# Patient Record
Sex: Female | Born: 1965 | Race: White | Hispanic: No | Marital: Married | Smoking: Never smoker
Health system: Southern US, Community
[De-identification: ages and names within clinical notes are randomized; demographics above are authoritative.]

## PROBLEM LIST (undated history)

## (undated) DIAGNOSIS — Z9889 Other specified postprocedural states: Secondary | ICD-10-CM

## (undated) DIAGNOSIS — F419 Anxiety disorder, unspecified: Secondary | ICD-10-CM

## (undated) DIAGNOSIS — R7303 Prediabetes: Secondary | ICD-10-CM

## (undated) DIAGNOSIS — T8859XA Other complications of anesthesia, initial encounter: Secondary | ICD-10-CM

## (undated) DIAGNOSIS — T4145XA Adverse effect of unspecified anesthetic, initial encounter: Secondary | ICD-10-CM

## (undated) DIAGNOSIS — F329 Major depressive disorder, single episode, unspecified: Secondary | ICD-10-CM

## (undated) DIAGNOSIS — R112 Nausea with vomiting, unspecified: Secondary | ICD-10-CM

## (undated) DIAGNOSIS — F32A Depression, unspecified: Secondary | ICD-10-CM

## (undated) DIAGNOSIS — I10 Essential (primary) hypertension: Secondary | ICD-10-CM

## (undated) DIAGNOSIS — T7840XA Allergy, unspecified, initial encounter: Secondary | ICD-10-CM

## (undated) DIAGNOSIS — Z8619 Personal history of other infectious and parasitic diseases: Secondary | ICD-10-CM

## (undated) DIAGNOSIS — G43909 Migraine, unspecified, not intractable, without status migrainosus: Secondary | ICD-10-CM

## (undated) DIAGNOSIS — M1990 Unspecified osteoarthritis, unspecified site: Secondary | ICD-10-CM

## (undated) DIAGNOSIS — I499 Cardiac arrhythmia, unspecified: Secondary | ICD-10-CM

## (undated) HISTORY — PX: ENDOMETRIAL ABLATION: SHX621

## (undated) HISTORY — PX: CHOLECYSTECTOMY: SHX55

## (undated) HISTORY — DX: Personal history of other infectious and parasitic diseases: Z86.19

## (undated) HISTORY — DX: Anxiety disorder, unspecified: F41.9

## (undated) HISTORY — DX: Prediabetes: R73.03

## (undated) HISTORY — DX: Major depressive disorder, single episode, unspecified: F32.9

## (undated) HISTORY — DX: Allergy, unspecified, initial encounter: T78.40XA

## (undated) HISTORY — PX: TONSILLECTOMY AND ADENOIDECTOMY: SUR1326

## (undated) HISTORY — DX: Depression, unspecified: F32.A

## (undated) HISTORY — DX: Essential (primary) hypertension: I10

## (undated) HISTORY — DX: Migraine, unspecified, not intractable, without status migrainosus: G43.909

## (undated) HISTORY — PX: APPENDECTOMY: SHX54

---
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
## 1999-02-16 ENCOUNTER — Inpatient Hospital Stay (HOSPITAL_COMMUNITY): Admission: RE | Admit: 1999-02-16 | Discharge: 1999-02-17 | Payer: Self-pay | Admitting: Surgery

## 1999-02-16 ENCOUNTER — Encounter: Payer: Self-pay | Admitting: Surgery

## 2000-01-12 ENCOUNTER — Other Ambulatory Visit: Admission: RE | Admit: 2000-01-12 | Discharge: 2000-01-12 | Payer: Self-pay | Admitting: Family Medicine

## 2000-09-01 ENCOUNTER — Encounter: Payer: Self-pay | Admitting: Obstetrics and Gynecology

## 2000-09-01 ENCOUNTER — Ambulatory Visit (HOSPITAL_COMMUNITY): Admission: RE | Admit: 2000-09-01 | Discharge: 2000-09-01 | Payer: Self-pay | Admitting: Obstetrics and Gynecology

## 2001-08-21 ENCOUNTER — Other Ambulatory Visit: Admission: RE | Admit: 2001-08-21 | Discharge: 2001-08-21 | Payer: Self-pay | Admitting: Family Medicine

## 2002-06-27 ENCOUNTER — Other Ambulatory Visit: Admission: RE | Admit: 2002-06-27 | Discharge: 2002-06-27 | Payer: Self-pay | Admitting: Obstetrics and Gynecology

## 2002-07-25 ENCOUNTER — Encounter (INDEPENDENT_AMBULATORY_CARE_PROVIDER_SITE_OTHER): Payer: Self-pay | Admitting: Specialist

## 2002-07-25 ENCOUNTER — Ambulatory Visit (HOSPITAL_COMMUNITY): Admission: RE | Admit: 2002-07-25 | Discharge: 2002-07-25 | Payer: Self-pay | Admitting: Obstetrics and Gynecology

## 2004-06-21 ENCOUNTER — Encounter: Admission: RE | Admit: 2004-06-21 | Discharge: 2004-06-21 | Payer: Self-pay | Admitting: Family Medicine

## 2004-07-29 ENCOUNTER — Other Ambulatory Visit: Admission: RE | Admit: 2004-07-29 | Discharge: 2004-07-29 | Payer: Self-pay | Admitting: Obstetrics and Gynecology

## 2004-08-03 ENCOUNTER — Encounter: Admission: RE | Admit: 2004-08-03 | Discharge: 2004-08-03 | Payer: Self-pay | Admitting: Obstetrics and Gynecology

## 2004-08-09 ENCOUNTER — Ambulatory Visit (HOSPITAL_COMMUNITY): Admission: RE | Admit: 2004-08-09 | Discharge: 2004-08-09 | Payer: Self-pay | Admitting: Obstetrics and Gynecology

## 2005-10-13 IMAGING — US US ABDOMEN COMPLETE
1 series · 13 of 25 positions shown · non-contrast
Comparison: none

CLINICAL DATA: Abdominal bruit heard on physical exam.  Evaluate for abdominal aortic aneurysm.     
 COMPLETE ABDOMINAL ULTRASOUND: 
 Comparison none. 
 Real time multiplanar gray-scale ultrasonography of the abdomen reveals no evidence for aneurysmal dilatation.  Maximum obtainable diameter is in the AP plane at 2.3cm.  
 The liver has diffusely coarsened echotexture throughout although no focal abnormality is identified.  The imaged portions of the pancreatic head and body are unremarkable although the tail is not well seen.  There is no evidence for intra or extrahepatic biliary duct dilatation with the extrahepatic common duct measuring 4-5mm in diameter.  
 The right kidney is 12.1cm in long axis.  Left kidney measures 12.2cm.  There appears to be mild fullness in the intrarenal collecting system on the left.  
 Spleen measures 15cm in craniocaudal length which is technically enlarged.  No focal abnormality is seen in the splenic parenchyma.

[Series 1: unknown · 13 of 50 slices shown]
[im 1/50]
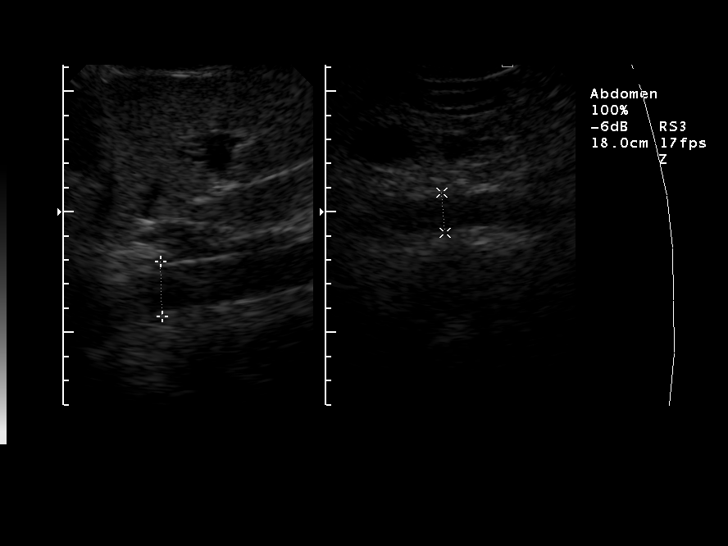
[im 5/50]
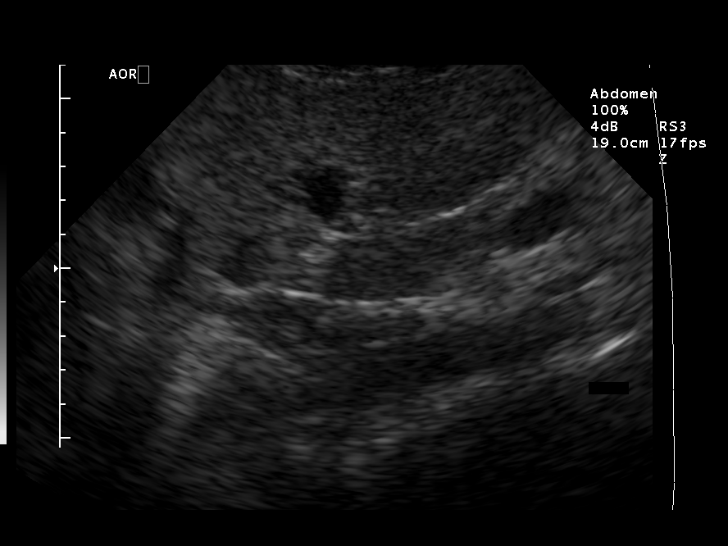
[im 9/50]
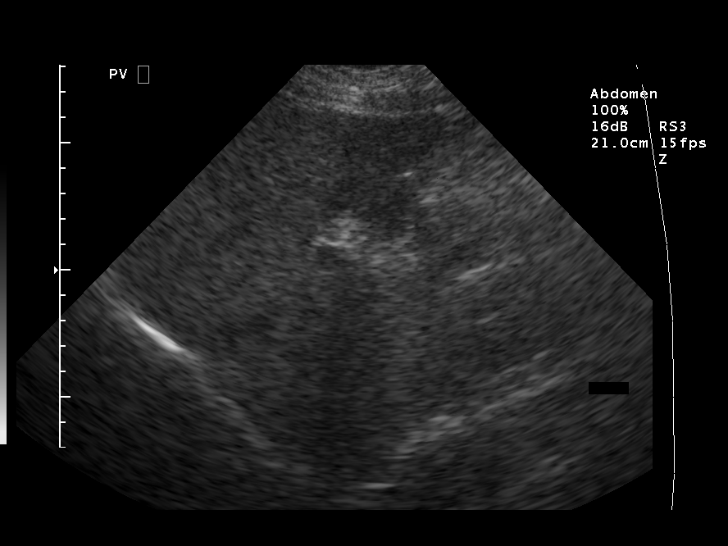
[im 13/50]
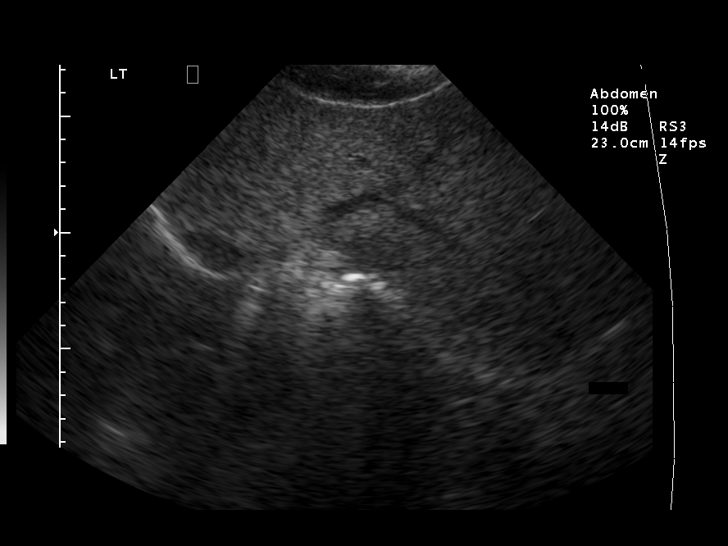
[im 17/50]
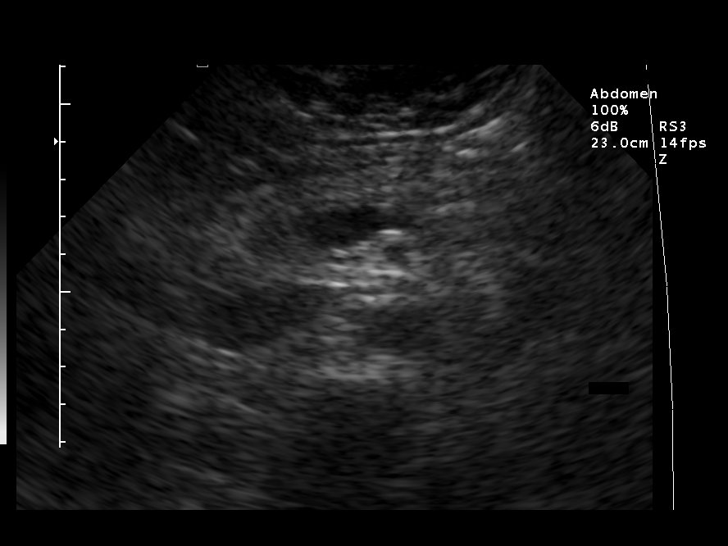
[im 21/50]
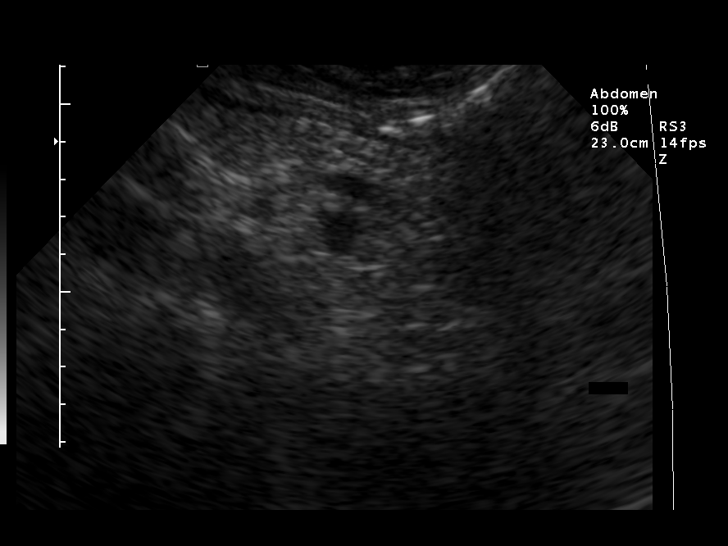
[im 25/50]
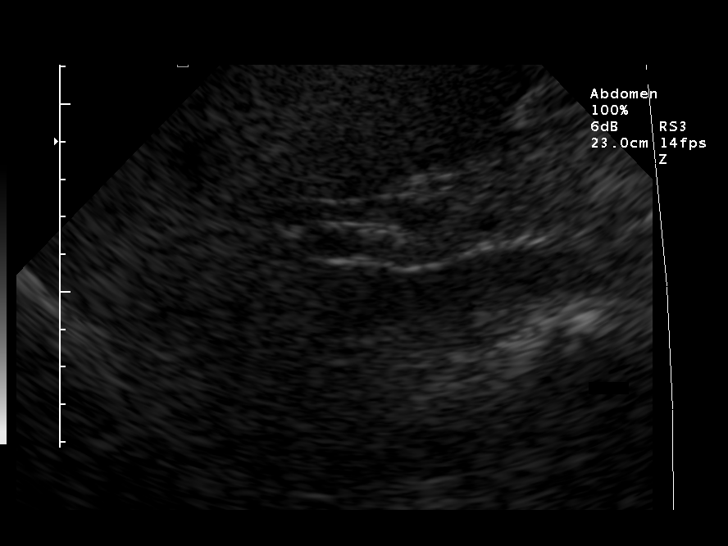
[im 29/50]
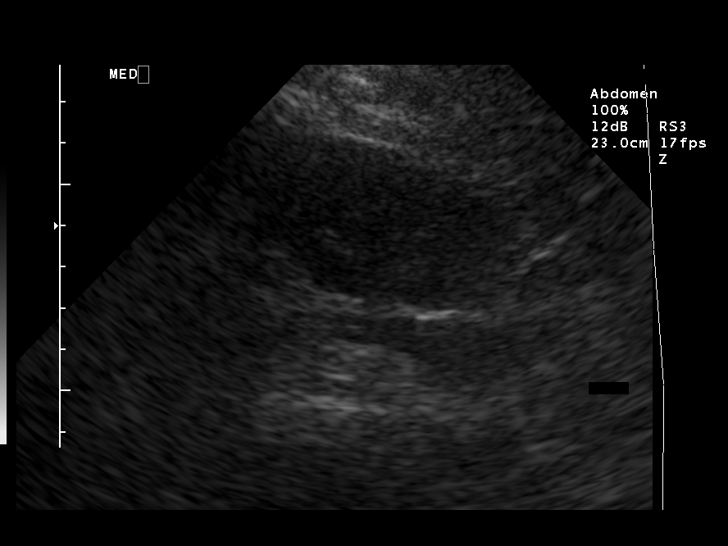
[im 33/50]
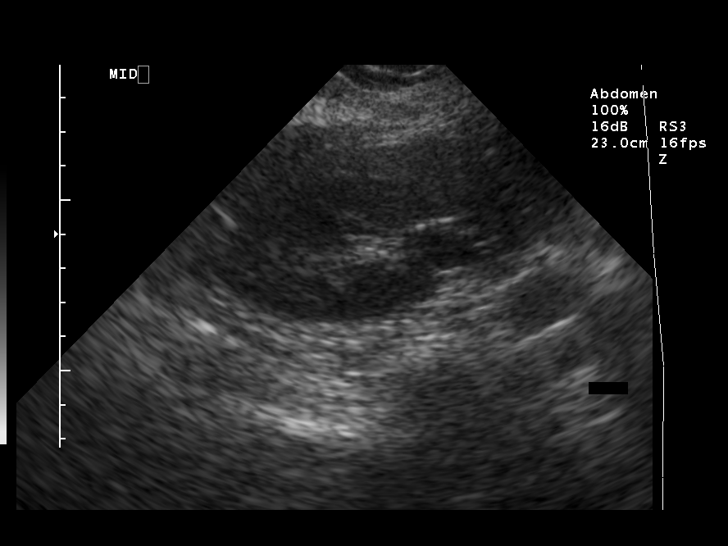
[im 37/50]
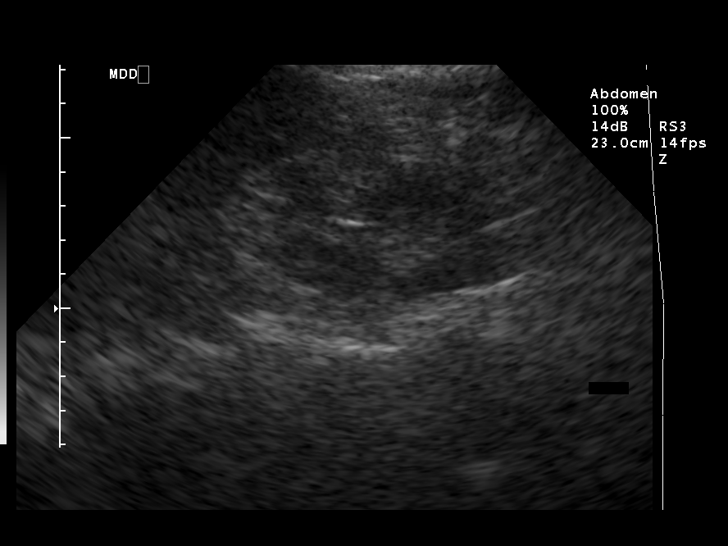
[im 41/50]
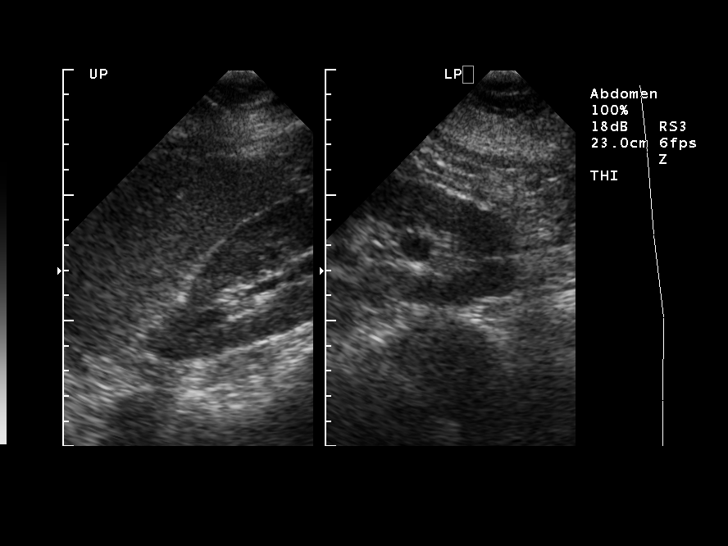
[im 45/50]
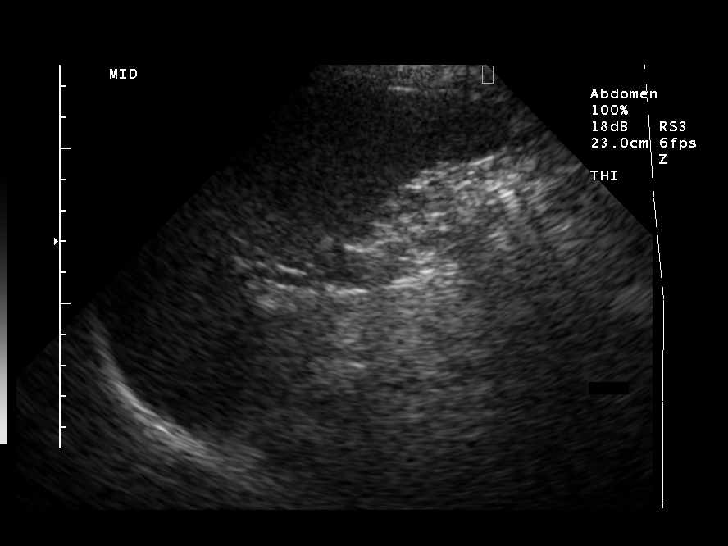
[im 50/50]
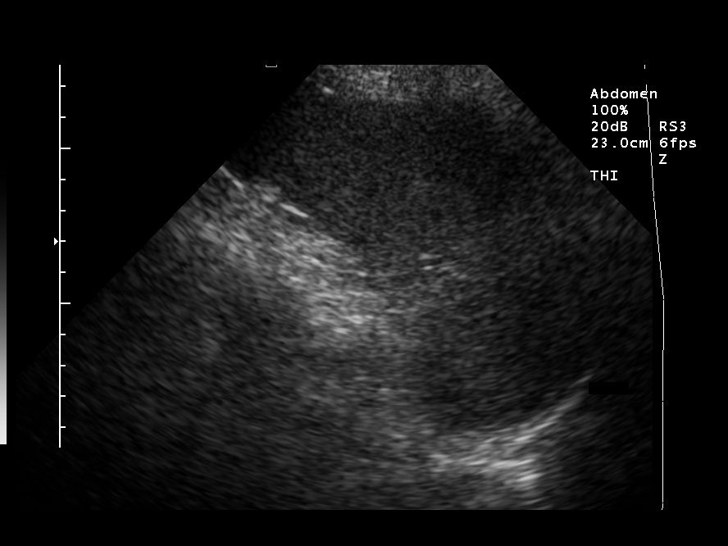

[13 of 25 positions shown; findings below may reference images not displayed]

IMPRESSION: 1.  No abdominal aortic aneurysm.  Renal artery stenosis can generate abdominal bruits and the renal arteries have not been evaluated as part of this general abdominal ultrasound.  
 2.  Splenomegaly. 
 3.  Mild fullness of the left intrarenal collecting system of indeterminate etiology.

## 2009-07-16 ENCOUNTER — Emergency Department (HOSPITAL_COMMUNITY): Admission: EM | Admit: 2009-07-16 | Discharge: 2009-07-16 | Payer: Self-pay | Admitting: Emergency Medicine

## 2011-01-28 LAB — POCT CARDIAC MARKERS
Myoglobin, poc: 67.8 ng/mL (ref 12–200)
Myoglobin, poc: 79.1 ng/mL (ref 12–200)
Troponin i, poc: <0.05 ng/mL (ref 0.00–0.09)

## 2011-01-28 LAB — BASIC METABOLIC PANEL
BUN: 6 mg/dL (ref 6–23)
CO2: 23 mEq/L (ref 19–32)
Chloride: 106 mEq/L (ref 96–112)
GFR calc non Af Amer: >60 mL/min (ref >60)
Glucose, Bld: 123 mg/dL — ABNORMAL HIGH (ref 70–99)

## 2011-01-28 LAB — DIFFERENTIAL
Basophils Relative: 0 % (ref 0–1)
Eosinophils Relative: 2 % (ref 0–5)
Lymphs Abs: 2.2 10*3/uL (ref 0.7–4.0)
Neutro Abs: 7.3 10*3/uL (ref 1.7–7.7)
Neutrophils Relative %: 72 % (ref 43–77)

## 2011-01-28 LAB — URINALYSIS, ROUTINE W REFLEX MICROSCOPIC
Nitrite: NEGATIVE
Specific Gravity, Urine: 1.01 (ref 1.005–1.030)

## 2011-01-28 LAB — URINE MICROSCOPIC-ADD ON

## 2011-01-28 LAB — CBC
HCT: 35.3 % — ABNORMAL LOW (ref 36.0–46.0)
Hemoglobin: 12.1 g/dL (ref 12.0–15.0)
MCHC: 34.1 g/dL (ref 30.0–36.0)
MCV: 81.8 fL (ref 78.0–100.0)
Platelets: 250 10*3/uL (ref 150–400)
RDW: 14.3 % (ref 11.5–15.5)

## 2011-03-11 NOTE — Op Note (Signed)
   NAME:  Brandy King, Brandy King NO.:  [id number]   MEDICAL RECORD NO.:  [id number]                   PATIENT TYPE:  AMB   LOCATION:  SDC                                  FACILITY:  [facility]   PHYSICIAN:  Malva Limes, M.D.                 DATE OF BIRTH:  28-Jul-1966   DATE OF PROCEDURE:  07/25/2002  DATE OF DISCHARGE:                                 OPERATIVE REPORT   PREOPERATIVE DIAGNOSES:  1. Menorrhagia.  2. Dysmenorrhea.   POSTOPERATIVE DIAGNOSES:  1. Menorrhagia.  2. Dysmenorrhea.   PROCEDURES:  1. Hysteroscopy.  2. Dilatation and curettage.  3. Cryoablation of endometrial cavity.   SURGEON:  Malva Limes, M.D.   ANESTHESIA:  General.   ANTIBIOTICS:  Ancef 1 gram.   DRAINS:  Red rubber catheter, bladder.   ESTIMATED BLOOD LOSS:  Minimal.   COMPLICATIONS:  None.   SPECIMENS:  Endocervical and endometrial curettings sent to pathology.   PROCEDURE:  The patient was taken to the operating room where she was placed  in the dorsal supine position and general anesthetic was administered  without complications.  She was then placed in the dorsal lithotomy position  and prepped with Hibiclens.  Her bladder was drained with a red rubber  catheter.  The patient was draped in the usual fashion for this procedure.  A sterile speculum was placed in the vagina.  A single-tooth tenaculum was  applied to the anterior cervical lip.  The uterine cavity was then sounded  to 9 cm.  The cervical os was then serially dilated to 29 [place].  The  hysteroscope was advanced through the endocervical canal, which appeared to  be normal.  On entering the uterine cavity, both ostia were visualized with  ease.  There was no evidence of any polyps, tumors, or leiomyomata.  At this  point, the hysteroscope was removed.  Endocervical curettage was performed  followed by an endometrial curetting.  At this point, the cryoablation  machine was prepared.  The probe was placed  into the right  cornua and frozen for seven minutes.  The left cornua was then frozen for  seven minutes.  The lower uterine segment was frozen for four minutes.  The  patient tolerated the procedure well.  She was taken to the recovery room.  She was sent home with Anaprox double strength to take p.r.n.  She was given  Toradol in the recovery room.                                                  Malva Limes, M.D.    MA/MEDQ  D:  07/25/2002  T:  07/25/2002  Job:  [id number]

## 2014-12-10 ENCOUNTER — Other Ambulatory Visit: Payer: Self-pay

## 2014-12-10 DIAGNOSIS — Z1231 Encounter for screening mammogram for malignant neoplasm of breast: Secondary | ICD-10-CM

## 2014-12-23 ENCOUNTER — Other Ambulatory Visit: Payer: Self-pay

## 2014-12-23 ENCOUNTER — Encounter (INDEPENDENT_AMBULATORY_CARE_PROVIDER_SITE_OTHER): Payer: Self-pay

## 2014-12-23 ENCOUNTER — Ambulatory Visit
Admission: RE | Admit: 2014-12-23 | Discharge: 2014-12-23 | Disposition: A | Discharge status: Home / Self Care | Payer: BLUE CROSS/BLUE SHIELD | Source: Ambulatory Visit

## 2014-12-23 DIAGNOSIS — Z1231 Encounter for screening mammogram for malignant neoplasm of breast: Secondary | ICD-10-CM

## 2015-11-04 ENCOUNTER — Ambulatory Visit (INDEPENDENT_AMBULATORY_CARE_PROVIDER_SITE_OTHER): Payer: 59 | Admitting: Family Medicine

## 2015-11-04 ENCOUNTER — Telehealth: Payer: Self-pay | Admitting: Family Medicine

## 2015-11-04 ENCOUNTER — Encounter: Payer: Self-pay | Admitting: Family Medicine

## 2015-11-04 VITALS — BP 113/73 | HR 63 | Temp 98.0°F | Resp 20 | Ht 68.0 in | Wt 330.8 lb

## 2015-11-04 DIAGNOSIS — I10 Essential (primary) hypertension: Secondary | ICD-10-CM

## 2015-11-04 DIAGNOSIS — E8881 Metabolic syndrome: Secondary | ICD-10-CM

## 2015-11-04 DIAGNOSIS — Z7189 Other specified counseling: Secondary | ICD-10-CM

## 2015-11-04 DIAGNOSIS — F329 Major depressive disorder, single episode, unspecified: Secondary | ICD-10-CM

## 2015-11-04 DIAGNOSIS — N951 Menopausal and female climacteric states: Secondary | ICD-10-CM

## 2015-11-04 DIAGNOSIS — Z6841 Body Mass Index (BMI) 40.0 and over, adult: Secondary | ICD-10-CM

## 2015-11-04 DIAGNOSIS — R5383 Other fatigue: Secondary | ICD-10-CM

## 2015-11-04 DIAGNOSIS — F32A Depression, unspecified: Secondary | ICD-10-CM

## 2015-11-04 DIAGNOSIS — L659 Nonscarring hair loss, unspecified: Secondary | ICD-10-CM

## 2015-11-04 DIAGNOSIS — K219 Gastro-esophageal reflux disease without esophagitis: Secondary | ICD-10-CM

## 2015-11-04 DIAGNOSIS — R7303 Prediabetes: Secondary | ICD-10-CM

## 2015-11-04 DIAGNOSIS — Z7689 Persons encountering health services in other specified circumstances: Secondary | ICD-10-CM

## 2015-11-04 LAB — CBC WITH DIFFERENTIAL/PLATELET
BASOS ABS: 0 10*3/uL (ref 0.0–0.1)
Basophils Relative: 0.5 % (ref 0.0–3.0)
EOS ABS: 0.1 10*3/uL (ref 0.0–0.7)
Eosinophils Relative: 1.7 % (ref 0.0–5.0)
HCT: 37.4 % (ref 36.0–46.0)
Hemoglobin: 12.4 g/dL (ref 12.0–15.0)
LYMPHS ABS: 2.4 10*3/uL (ref 0.7–4.0)
LYMPHS PCT: 29.2 % (ref 12.0–46.0)
MCHC: 33.1 g/dL (ref 30.0–36.0)
MCV: 83.7 fl (ref 78.0–100.0)
MONOS PCT: 5.5 % (ref 3.0–12.0)
Monocytes Absolute: 0.5 10*3/uL (ref 0.1–1.0)
NEUTROS ABS: 5.2 10*3/uL (ref 1.4–7.7)
NEUTROS PCT: 63.1 % (ref 43.0–77.0)
PLATELETS: 239 10*3/uL (ref 150.0–400.0)
RBC: 4.47 Mil/uL (ref 3.87–5.11)
RDW: 15.4 % (ref 11.5–15.5)
WBC: 8.2 10*3/uL (ref 4.0–10.5)

## 2015-11-04 LAB — COMPREHENSIVE METABOLIC PANEL
ALK PHOS: 72 U/L (ref 39–117)
ALT: 23 U/L (ref 0–35)
AST: 19 U/L (ref 0–37)
Albumin: 4.2 g/dL (ref 3.5–5.2)
BILIRUBIN TOTAL: 0.5 mg/dL (ref 0.2–1.2)
BUN: 12 mg/dL (ref 6–23)
CO2: 31 meq/L (ref 19–32)
CREATININE: 0.66 mg/dL (ref 0.40–1.20)
Calcium: 9.5 mg/dL (ref 8.4–10.5)
Chloride: 103 mEq/L (ref 96–112)
GFR: 100.75 mL/min (ref >60.00)
GLUCOSE: 124 mg/dL — AB (ref 70–99)
Potassium: 4.2 mEq/L (ref 3.5–5.1)
Sodium: 142 mEq/L (ref 135–145)
TOTAL PROTEIN: 7.2 g/dL (ref 6.0–8.3)

## 2015-11-04 LAB — LIPID PANEL
CHOLESTEROL: 218 mg/dL — AB (ref 0–200)
HDL: 44.6 mg/dL (ref >39.00)
LDL CALC: 143 mg/dL — AB (ref 0–99)
NonHDL: 173.57
TRIGLYCERIDES: 153 mg/dL — AB (ref 0.0–149.0)
Total CHOL/HDL Ratio: 5
VLDL: 30.6 mg/dL (ref 0.0–40.0)

## 2015-11-04 LAB — T4, FREE: Free T4: 0.71 ng/dL (ref 0.60–1.60)

## 2015-11-04 LAB — FERRITIN: FERRITIN: 111 ng/mL (ref 10.0–291.0)

## 2015-11-04 LAB — HEMOGLOBIN A1C: Hgb A1c MFr Bld: 6.3 % (ref 4.6–6.5)

## 2015-11-04 LAB — VITAMIN B12: Vitamin B-12: 378 pg/mL (ref 211–911)

## 2015-11-04 LAB — TSH: TSH: 2.48 u[IU]/mL (ref 0.35–4.50)

## 2015-11-04 LAB — VITAMIN D 25 HYDROXY (VIT D DEFICIENCY, FRACTURES): VITD: 15.39 ng/mL — ABNORMAL LOW (ref 30.00–100.00)

## 2015-11-04 MED ORDER — VENLAFAXINE HCL ER 150 MG PO CP24: ORAL_CAPSULE | ORAL | Status: DC

## 2015-11-04 MED ORDER — BISOPROLOL-HYDROCHLOROTHIAZIDE 10-6.25 MG PO TABS: 1.0000 | ORAL_TABLET | Freq: Every day | ORAL | Status: DC

## 2015-11-04 MED ORDER — OMEPRAZOLE 20 MG PO CPDR
20.0000 mg | DELAYED_RELEASE_CAPSULE | Freq: Every day | ORAL | Status: DC
Start: 2015-11-04 — End: 2016-10-13

## 2015-11-04 NOTE — Progress Notes (Signed)
Subjective:    Patient ID: Brandy King, female    DOB: 1966-04-28, 50 y.o.   MRN: [id number]  HPI  Patient presents for new patient establishment multiple compliants. All past medical history, surgical history, allergies, family history, immunizations and social history was obtained from the patient today and entered into the electronic medical record. Records are requested from her prior PCP, and will be reviewed at the time they are received. All medical records will be updated at that time.  Patient complains of hair loss, fatigue and weight gain. She states she is perimenopausal, skipping periods, having menstrual cycles approximately every 3-4 months. She endorses hot flashes, night sweats. Patient endorses weight gain approximately 30 pounds within the last 6 months.   Depression: Patient states she has been on Effexor approximate 2 years. She feels her depression is stable, however she still wishes she could improve her feelings. She states that she has weight gain over the last 6 months along with fatigue and no motivation. She feels some of her depression is surrounding her weight. She feels that people stare at her because she is "the largest one in the group ". SHe doesn't want to go shopping because she does not like how anything looks on her. She also admits that her children are now grown, last just finished college, so she is home alone compared to prior. Patient is not exercising routinely. She does not have an outlet for an activity that she enjoys, and does for herself. She states she does enjoy being around her family. She used to enjoy water aerobics.  Hypertension: Patient states she's been on bisoprolol-HCTZ 10-6 25 mg for 5-6 years. Her blood pressure has been controlled on this medication. She does not watch the salt content in her diet. She does not exercise routinely. Has any chest pain, shortness breath, lower extremity edema or claudication.  Health maintenance:    Colonoscopy: No family history, routine screening at age 30. Mammogram: Completed 2016. BI-RADS 1. No family history. Routine screening Cervical cancer screening: Unknown. Records requested Immunizations: Unknown, declines flu yearly. Records have been requested. Infectious disease screening: Unknown,  records have been requested. DEXA: Unknown, records requested   Past Medical History  Diagnosis Date  . Hypertension   . Allergy   . Anxiety   . Depression   . Migraine   . History of chicken pox    Allergies  Allergen Reactions  . Asa [Aspirin] Other (See Comments)    swelling  . Ibuprofen Other (See Comments)    swelling  . Prozac [Fluoxetine Hcl]    Past Surgical History  Procedure Laterality Date  . Cholestecomy  1977  . Endometrial ablation      Dr Ouida Sills 2005  . Tonsillectomy and adenoidectomy    . Cholecystectomy    . Appendectomy    . Cesarean section  1988   Family History  Problem Relation Age of Onset  . Diabetes Mother   . Diabetes Father   . Heart disease Father   . Arthritis Father   . Cancer Father     Blood cancer (unknown)   . Cancer Paternal Aunt     bladder  . Cancer Paternal Uncle     sternal tumor/kidney cancer   Social History   Social History  . Marital Status: Married    Spouse Name: N/A  . Number of Children: N/A  . Years of Education: N/A   Occupational History  . Not on file.   Social  History Main Topics  . Smoking status: Never Smoker   . Smokeless tobacco: Never Used  . Alcohol Use: No  . Drug Use: No  . Sexual Activity: Yes    Birth Control/ Protection: Surgical     Comment: vasectomy, husband   Other Topics Concern  . Not on file   Social History Narrative   Married, Brandy King, 4 children.    Internet sales, HS diploma.    Patient does not drink caffeinated beverage   Patient uses herbal remedies. Patient wears her seatbelt. Patient wears a bike helmet.   There is a smoke detector in her home. There are firearms  in her home.   She feels safe in her relationship.        Review of Systems Negative, with the exception of above mentioned in HPI     Objective:   Physical Exam BP 113/73 mmHg  Pulse 63  Temp(Src) 98 F (36.7 C)  Resp 20  Ht 5' 8"  (1.727 m)  Wt 330 lb 12 oz (150.027 kg)  BMI 50.30 kg/m2  SpO2 95%  LMP 09/16/2015 Gen: Afebrile. No acute distress. Nontoxic in appearance, well-developed, well-nourished, Caucasian female. Very pleasant. Morbidly obese HENT: AT. [place]. Bilateral TM visualized and normal in appearance. MMM, no oral lesions. Bilateral nares without erythema or swelling. Throat without erythema or exudates.  Eyes:Pupils Equal Round Reactive to light, Extraocular movements intact,  Conjunctiva without redness, discharge or icterus. Neck/lymp/endocrine: Supple, no lymphadenopathy, no thyromegaly CV: RRR , no edema, +2/4 P posterior tibialis pulses Chest: CTAB, no wheeze or crackles Abd: Soft. Obese. NTND. BS present MSK: No erythema, no soft tissue swelling, no obvious deformities. Full range of motion. Neurovascular intact distally. Skin: No rashes, purpura or petechiae. Warm and well perfused. Intact Neuro: Normal gait. PERLA. EOMi. Alert. Oriented x3  Psych: Normal affect, dress and demeanor. Normal speech. Normal thought content and judgment.    Assessment & Plan:  [name] is 50 y.o. female present for establishment of care. Depression - Stable, but could use some improvement. - Discussed with patient today attempting to find an activity that she can find enjoyment at least 1-2 times a week. She seems to had pleasure in swimming/aquatics in the past. Encouraged her to contact [employer] and her to look into exercise/aquatics classes. 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 Discussed exercise and diet with her today, at  least 150 minutes of exercise weekly. A diet that is low carbohydrate and sugar, lean meats impress veggies. - Continue Effexor. Refills provided. - Follow-up in 6 months, sooner if needed. - Essential hypertension, benign - Stable. Continue his bisoprolol/HCTZ 10-6 0.25. Low-salt diet. Increase exercise to at least 150 minutes weekly. Attempt weight loss with behavior modifications, dietary changes. - CBC w/Diff - Comp Met (CMET) - Lipid panel  Other fatigue/hair loss - New. Increase in fatigue could be secondary to vitamin deficiency, thyroid disease or depression. Discussed weight loss, exercise and depression in detail with patient today. Will obtain CBC, CMP, vitamin D, B-12, TSH, T4 and ferritin level today.  Gastroesophageal reflux disease without esophagitis - Recurrent. Discussed with patient that weight loss would likely improve her recent flare of reflux. Discussed GERD diet. No eating at least 3 hours prior to lying down. Will restart omeprazole for 8-12 weeks. - omeprazole (PRILOSEC) 20 MG capsule; Take 1 capsule (20 mg total) by  mouth daily.  Dispense: 30 capsule; Refill: 3  Perimenopausal  - Patient still having hot flashes, Effexor doesn't seem to be effective to subside perimenopausal symptoms. Patient is taking for depression. Consider adding another SSRI for hot flashes if patient desires. Currently patient is able to sustain perimenopausal symptoms.  BMI 50.0-59.9, adult (HCC) - HgB A1c - Lipid panel  Health maintenance:  Colonoscopy: No family history, routine screening at age 71. Mammogram: Completed 2016. BI-RADS 1. No family history. Routine screening Cervical cancer screening: Unknown. Records requested Immunizations: Unknown, declines flu yearly. Records have been requested. Infectious disease screening: Unknown,  records have been requested. DEXA: Unknown, records requested  Within 2 months follow-up for complete exam/preventative physical Follow-up 4-6  months, sooner if labs indicate need, or patient worsening condition.  Greater than 45 minutes was spent with patient, greater than 50% of that time was spent face-to-face with patient counseling and coordinating care.

## 2015-11-04 NOTE — Telephone Encounter (Addendum)
I left message (not as much detail as below) for the pt discussing abnormal labs and the need to talk about starting medications. Message advised her to call back and staff can give full message below.   - Her vitamin D level is very low, this could be causing some of her fatigue. Her level was 15.3, ideally this should be up in the 40s. I have called in a supplementation for her to take 1 pill 2 times a week for 8 weeks, we will then need to retest her vitamin D level at that time. - Her fasting glucose is elevated at 124, and her A1c which looks a diabetes is elevated at 6.3. She would benefit from low-dose metformin medication to help lower her blood sugar. At the current level she is at the highest and possible of what is considered prediabetes. I know she did not have a history of this in the past, technically diabetes is A1c greater than 6.5 and elevated blood sugar above 125. If she is amendable, I would like to start the metformin low-dose while she works on her weight loss, exercise program etc. - Her cholesterol is elevated, total cholesterol 218, triglycerides 153, LDL 143. With her history of hypertension, prediabetes, and family history of heart disease in her father we need to to monitor her cholesterol closely, this needs to be lower. Ideally I would like to have her total cholesterol below 200 and her LDL below 100. Patient could benefit from a low-dose cholesterol-lowering medication called a statin. Since they do not know what her prior cholesterol readings were, we could also attempt exercise, low carb/sugar/saturated fat diet and fish oil supplementation for 6 months, and see if she can bring her cholesterol down with the above regimen. A statin medication would give her more cardiovascular protection (heart attack/stroke). - retest her cholesterol in 6 months regardless of her decision. - Physical to be scheduled in 2 months, can obtain a vitamin D level at that appointment. Can also go  into results in more detail.

## 2015-11-04 NOTE — Patient Instructions (Signed)
It was a pleasure meeting you today.  We will call you with labs once available.  Will plan follow up on chronic issues once results received Otherwise will want to see you for a preventive only exam in 2-3 months, after I receive records (this will be your physical etc)

## 2015-11-05 MED ORDER — CHOLECALCIFEROL 1.25 MG (50000 UT) PO CAPS: 50000.0000 [IU] | ORAL_CAPSULE | ORAL | Status: DC

## 2015-11-06 DIAGNOSIS — E8881 Metabolic syndrome: Secondary | ICD-10-CM | POA: Insufficient documentation

## 2015-11-06 DIAGNOSIS — E119 Type 2 diabetes mellitus without complications: Secondary | ICD-10-CM | POA: Insufficient documentation

## 2015-11-06 MED ORDER — METFORMIN HCL 500 MG PO TABS: ORAL_TABLET | ORAL | Status: DC

## 2015-11-06 NOTE — Telephone Encounter (Signed)
Patient is aware of all results. Patient states that she is okay with beginning metformin.  She also questioned a diet that was mentioned in her OV.  She wanted to know if that was something she needed to come into the office to get or if it was a hand out of some sort.  Please advise.  Patient also would like to try to lose weight and begin diet regimen before beginning statin drug.

## 2015-11-06 NOTE — Telephone Encounter (Signed)
Metformin prescribed.  Diet recommended was low carbohydrate, fresh vegetables. Lean baked meats. I have placed a nutrition referral for her to discuss diabetic diet, portion/caloreis etc.

## 2015-11-09 NOTE — Telephone Encounter (Signed)
Left message for patient with instructions for diet and referral information on patient voice mail

## 2015-11-24 ENCOUNTER — Encounter: Payer: 59 | Attending: Family Medicine | Admitting: Dietician

## 2015-11-24 ENCOUNTER — Encounter: Payer: Self-pay | Admitting: Dietician

## 2015-11-24 VITALS — Ht 67.5 in | Wt 328.0 lb

## 2015-11-24 DIAGNOSIS — R7303 Prediabetes: Secondary | ICD-10-CM

## 2015-11-24 DIAGNOSIS — Z713 Dietary counseling and surveillance: Secondary | ICD-10-CM | POA: Insufficient documentation

## 2015-11-24 DIAGNOSIS — Z6841 Body Mass Index (BMI) 40.0 and over, adult: Secondary | ICD-10-CM | POA: Insufficient documentation

## 2015-11-24 NOTE — Progress Notes (Signed)
  Medical Nutrition Therapy:  Appt start time: 0810 end time:  0910.  Assessment:  Primary concerns today: Patient is here alone.  She wants to learn better to improve her blood sugar and high cholesterol.  HgbA1C 6.3% 11/04/15.  Cholesterol 218, Trig 153, HDL 45, LDL 143.  Other hx includes HTN,  GERD, and newly diagnosed vitamin D deficiency.  She is sleeping well now but was waking 2-3 times per night prior to MD appointment 11/04/15.  Patient reports stress eating.  Weight Hx:   135 lbs prior to having children.   Highest: 328 lbs today 250 lbs for 20 years  30 lb weight gain in 4-5 months but stable for the past few months.  Patient lives with her husband and 3 children who are in college.  She is employed at [employer] in [place].   Her husband does the shopping and cooking.  Family is supportive to making healthier changes with eating and going together to the gym.  Preferred Learning Style:   No preference indicated   Learning Readiness:   Ready  MEDICATIONS: see list, includes tumeric   DIETARY INTAKE: Uses Coconut oil. 24-hr recall:  B (7:30 AM): cheerios, 2% milk Snk ( AM): none  L (12:30 PM): [employer]: salad with salad, or vegetable plate or grilled chicken and vegetables or Pizza Snk ( PM): occasional almonds or peanut butter cracker D (6:30 PM): "This is my problem because my husband cooks." (country, or [place]) Snk ( PM): none Beverages: water  "Went off soda in November."  Usual physical activity: ADL's    Estimated energy needs: 1500 calories 170 g carbohydrates 112 g protein 42 g fat  Progress Towards Goal(s):  In progress.   Nutritional Diagnosis:  NB-1.1 Food and nutrition-related knowledge deficit As related to balance of carbohydrate, protein, and fat.  As evidenced by patient report and diet hx..    Intervention:  Nutrition counseling and diabetes education initiated. Discussed Carb Counting by food group as method of portion control, reading  food labels, and benefits of increased activity. Also discussed basic physiology of Diabetes, target BG ranges pre and post meals, and A1c.  Also discussed mindful eating.  Teaching Method Utilized:  Visual Auditory Hands on  Handouts given during visit include: Living Well with Diabetes Carb Counting and Food Label handouts Meal Plan Card Breakfast Snack list  A1C sheet  Label reading  Weight loss resource sheet  Barriers to learning/adherence to lifestyle change: none  Demonstrated degree of understanding via:  Teach Back   Monitoring/Evaluation:  Dietary intake, exercise, label reading, and body weight prn.

## 2015-11-24 NOTE — Patient Instructions (Signed)
Eat slowly, at the table, stop when full. Consider putting fork down between bites. Consider smaller plate. Calorie King app Aim for 2-3 Carb Choices per meal (30-45 grams) +/- 1 either way  Aim for 0-1 Carbs per snack if hungry  Include protein in moderation with your meals and snacks Consider reading food labels for Total Carbohydrate and Fat Grams of foods Consider  increasing your activity level by walking for 30 minutes daily as tolerated.  Start with 5-10 minutes for a week and increase slowly to goal.

## 2016-02-03 ENCOUNTER — Telehealth: Payer: Self-pay | Admitting: Family Medicine

## 2016-02-03 NOTE — Telephone Encounter (Signed)
Patient is now self pay. She went to pick up her rx for generic Effexor and it was more that $400.00. She only have one dose left to get her through tomorrow and would greatly appreciate any direction you can give her on assistance or changing her medication.

## 2016-02-03 NOTE — Telephone Encounter (Signed)
Called pt to discuss: - tried to get pharmacist at [facility], where she fills her scripts to assist in helping me find her cheapest option he was not helpful and rude. I called the patient and told her the cheapest option is likely going to be the generic effexor (not extended release) 75 mg tabs- 2 tabs, twice a day. She desires an attempt to be weaned off medication. Will not be able to do that with one capsule she currently has. Instructed her to call [facility] to see there prices on above medication in a two week supply and call back tomorrow and we will call it in to her pharmacy of choice. Pt in agreement with plan. Pt is aware she can not abruptly stop this medication.

## 2016-02-04 ENCOUNTER — Telehealth: Payer: Self-pay | Admitting: Family Medicine

## 2016-02-04 MED ORDER — VENLAFAXINE HCL 75 MG PO TABS: 150.0000 mg | ORAL_TABLET | Freq: Two times a day (BID) | ORAL | Status: DC

## 2016-02-04 NOTE — Telephone Encounter (Signed)
After speaking with [facility] she is able to afford 75mg  120 tablets they are $124.07 and 60 tablets is $64.82. It is the [employer].

## 2016-02-04 NOTE — Telephone Encounter (Signed)
I advised her to try [facility] and let her know that she does not need a membership. She called back and now prefers that her Rx be sent to [facility]. 60 tablets is $22.64 and 120 tablets is only $31.78.

## 2016-02-04 NOTE — Telephone Encounter (Signed)
Left message letting patient know Rx's called in.

## 2016-02-04 NOTE — Telephone Encounter (Signed)
Order for Venlafaxine 75 mg tabs, take 2 tabs twice a day #120 pills, 2 refills  went to [facility]. Pt had voiced desire to taper medication off, not certain if she still desires this since the medication is more affordable at [facility] for her. We will need to discuss this if she wants to taper. I refilled the medication as a full 1 month with refills in the event she wants to remain on the medication.

## 2016-02-12 ENCOUNTER — Encounter: Payer: Self-pay | Admitting: Family Medicine

## 2016-02-12 ENCOUNTER — Ambulatory Visit (INDEPENDENT_AMBULATORY_CARE_PROVIDER_SITE_OTHER): Payer: 59 | Admitting: Family Medicine

## 2016-02-12 VITALS — BP 134/85 | HR 63 | Temp 97.8°F | Resp 20 | Ht 68.0 in | Wt 330.0 lb

## 2016-02-12 DIAGNOSIS — Z0000 Encounter for general adult medical examination without abnormal findings: Secondary | ICD-10-CM

## 2016-02-12 DIAGNOSIS — Z23 Encounter for immunization: Secondary | ICD-10-CM | POA: Diagnosis not present

## 2016-02-12 DIAGNOSIS — E559 Vitamin D deficiency, unspecified: Secondary | ICD-10-CM | POA: Insufficient documentation

## 2016-02-12 DIAGNOSIS — Z1231 Encounter for screening mammogram for malignant neoplasm of breast: Secondary | ICD-10-CM

## 2016-02-12 DIAGNOSIS — E119 Type 2 diabetes mellitus without complications: Secondary | ICD-10-CM | POA: Diagnosis not present

## 2016-02-12 DIAGNOSIS — Z1211 Encounter for screening for malignant neoplasm of colon: Secondary | ICD-10-CM | POA: Diagnosis not present

## 2016-02-12 LAB — POCT GLYCOSYLATED HEMOGLOBIN (HGB A1C): HEMOGLOBIN A1C: 5.9

## 2016-02-12 NOTE — Progress Notes (Signed)
Patient ID: Brandy King, female   DOB: August 30, 1966, 50 y.o.   MRN: [id number]      Patient ID: Brandy King, female  DOB: [birth year], 50 y.o.   MRN: [id number]  Subjective:  Brandy King is a 50 y.o. female present for annual exam  All past medical history, surgical history, allergies, family history, immunizations, medications and social history were updated  in the electronic medical record today. All recent labs, ED visits and hospitalizations within the last year were reviewed. Vit D, statin, metofrmin  Right Achilles tendon discomfort: Patient complains of right Achilles tendon discomfort since starting exercise regimen a few weeks ago. Patient denies any weakness. Pain seems to be worse with flexion of foot. She has tried nothing over-the-counter to help with the discomfort.  Prediabetes: Last a1c 6.3 and fasting glucose 124, Body mass index is 50.19 kg/(m^2). Started on metformin low dose and has tolerated well. She denies any negative GI side effects, she has been exercising, and attempting a lower carbohydrate/sugar diet. She is doing a  [employer] class, and seems to be enjoying herself.  Elevated cholesterol: She has had elevated cholesterol in the past. She has a family history of hyperlipidemia and heart disease. She is morbidly obese. She is currently not on any medications for her cholesterol. She is borderline diabetic.  Low vitamin D: She finished her vitamin D supplementation after having a low vitamin D level. She states she finished supplementation about 3 weeks ago. She has not started over-the-counter supplementation as of yet.   Health maintenance:  Colonoscopy: No family history, routine screening at age 24. Referral placed today. Mammogram: Completed 12/2014. BI-RADS 1. No family history. Routine screening. Mammogram ordered at [facility].  Cervical cancer screening: UTD reports 1.5 years ago. PAP indicated in 2018. Immunizations: Unknown, declines flu yearly. Records  have been requested. Infectious disease screening: Unknown, records have been requested. DEXA: Unknown, records requested Assistive devices: None Oxygen use: N/A Patient has a Dental home. Hospitalizations/ED visits: Reviewed  Results for Brandy King, Brandy King (MRN [id number]) as of 02/12/2016 08:35  Ref. Range 11/04/2015 09:55  Sodium Latest Ref Range: 135-145 mEq/L 142  Potassium Latest Ref Range: 3.5-5.1 mEq/L 4.2  Chloride Latest Ref Range: 96-112 mEq/L 103  CO2 Latest Ref Range: 19-32 mEq/L 31  BUN Latest Ref Range: 6-23 mg/dL 12  Creatinine Latest Ref Range: 0.40-1.20 mg/dL 0.66  Calcium Latest Ref Range: 8.4-10.5 mg/dL 9.5  Glucose Latest Ref Range: 70-99 mg/dL 124 (H)  Alkaline Phosphatase Latest Ref Range: 39-117 U/L 72  Albumin Latest Ref Range: 3.5-5.2 g/dL 4.2  AST Latest Ref Range: 0-37 U/L 19  ALT Latest Ref Range: 0-35 U/L 23  Total Protein Latest Ref Range: 6.0-8.3 g/dL 7.2  Total Bilirubin Latest Ref Range: 0.2-1.2 mg/dL 0.5  GFR Latest Ref Range: >60.00 mL/min 100.75  Cholesterol Latest Ref Range: 0-200 mg/dL 218 (H)  Triglycerides Latest Ref Range: 0.0-149.0 mg/dL 153.0 (H)  HDL Cholesterol Latest Ref Range: >39.00 mg/dL 44.60  LDL (calc) Latest Ref Range: 0-99 mg/dL 143 (H)  VLDL Latest Ref Range: 0.0-40.0 mg/dL 30.6  Total CHOL/HDL Ratio Unknown 5  NonHDL Unknown 173.57  Ferritin Latest Ref Range: 10.0-291.0 ng/mL 111.0  Vitamin B12 Latest Ref Range: 211-911 pg/mL 378  WBC Latest Ref Range: 4.0-10.5 K/uL 8.2  RBC Latest Ref Range: 3.87-5.11 Mil/uL 4.47  Hemoglobin Latest Ref Range: 12.0-15.0 g/dL 12.4  HCT Latest Ref Range: 36.0-46.0 % 37.4  MCV Latest Ref Range: 78.0-100.0 fl 83.7  MCHC  Latest Ref Range: 30.0-36.0 g/dL 33.1  RDW Latest Ref Range: 11.5-15.5 % 15.4  Platelets Latest Ref Range: 150.0-400.0 K/uL 239.0  Neutrophils Latest Ref Range: 43.0-77.0 % 63.1  Lymphocytes Latest Ref Range: 12.0-46.0 % 29.2  Monocytes Relative Latest Ref Range: 3.0-12.0 % 5.5    Eosinophil Latest Ref Range: 0.0-5.0 % 1.7  Basophil Latest Ref Range: 0.0-3.0 % 0.5  NEUT# Latest Ref Range: 1.4-7.7 K/uL 5.2  Lymphocyte # Latest Ref Range: 0.7-4.0 K/uL 2.4  Monocyte # Latest Ref Range: 0.1-1.0 K/uL 0.5  Eosinophils Absolute Latest Ref Range: 0.0-0.7 K/uL 0.1  Basophils Absolute Latest Ref Range: 0.0-0.1 K/uL 0.0  Hemoglobin A1C Latest Ref Range: 4.6-6.5 % 6.3  TSH Latest Ref Range: 0.35-4.50 uIU/mL 2.48  T4,Free(Direct) Latest Ref Range: 0.60-1.60 ng/dL 0.71    Past Medical History  Diagnosis Date  . Hypertension   . Allergy   . Anxiety   . Depression   . Migraine   . History of chicken pox   . Prediabetes    Allergies  Allergen Reactions  . Asa [Aspirin] Other (See Comments)    swelling  . Ibuprofen Other (See Comments)    swelling  . Prozac [Fluoxetine Hcl]    Past Surgical History  Procedure Laterality Date  . Cholestecomy  1977  . Endometrial ablation      Dr Ouida Sills 2005  . Tonsillectomy and adenoidectomy    . Cholecystectomy    . Appendectomy    . Cesarean section  1988   Family History  Problem Relation Age of Onset  . Diabetes Mother   . Diabetes Father   . Heart disease Father   . Arthritis Father   . Cancer Father     Blood cancer (unknown)   . Cancer Paternal Aunt     bladder  . Cancer Paternal Uncle     sternal tumor/kidney cancer   Social History   Social History  . Marital Status: Married    Spouse Name: N/A  . Number of Children: N/A  . Years of Education: N/A   Occupational History  . Not on file.   Social History Main Topics  . Smoking status: Never Smoker   . Smokeless tobacco: Never Used  . Alcohol Use: No  . Drug Use: No  . Sexual Activity: Yes    Birth Control/ Protection: Surgical     Comment: vasectomy, husband   Other Topics Concern  . Not on file   Social History Narrative   Married, Lanny Hurst, 4 children.    Internet sales, HS diploma.    Patient does not drink caffeinated beverage   Patient  uses herbal remedies. Patient wears her seatbelt. Patient wears a bike helmet.   There is a smoke detector in her home. There are firearms in her home.   She feels safe in her relationship.        ROS: Negative, with the exception of above mentioned in HPI  Objective: BP 134/85 mmHg  Pulse 63  Temp(Src) 97.8 F (36.6 C) (Oral)  Resp 20  Ht 5\' 8"  (1.727 m)  Wt 330 lb (149.687 kg)  BMI 50.19 kg/m2  SpO2 95% Gen: Afebrile. No acute distress. Nontoxic in appearance, well-developed, well-nourished, female, Obese female HENT: AT. [place]. Bilateral TM visualized and normal in appearance, normal external auditory canal. MMM, no oral lesions, good dentition. Bilateral nares without erythema or bogginess. Throat without erythema, ulcerations or exudates. No Cough on exam, no hoarseness on exam. Eyes:Pupils Equal Round Reactive to light, Extraocular  movements intact,  Conjunctiva without redness, discharge or icterus. Neck/lymp/endocrine: Supple, no lymphadenopathy, no thyromegaly CV: RRR no murmur appreciated, no edema, +2/4 P posterior tibialis pulses. No carotid bruits. No JVD. Chest: CTAB, no wheeze, rhonchi or crackles. Normal Respiratory effort. Good Air movement. Abd: Soft. Obese. NTND. BS present. No Masses palpated. No hepatosplenomegaly. No rebound tenderness or guarding. Skin: No rashes, purpura or petechiae. Warm and well-perfused. Skin intact. Neuro/Msk:  Normal gait. PERLA. EOMi. Alert. Oriented x3.  Cranial nerves II through XII intact. Muscle strength 5/5 upper and lower extremity. DTRs equal bilaterally. Psych: Normal affect, dress and demeanor. Normal speech. Normal thought content and judgment. Foot exam completed and documented in quality metrics Diabetic Foot Exam - Simple   Simple Foot Form  Diabetic Foot exam was performed with the following findings:  Yes 02/12/2016  1:19 PM  Visual Inspection  No deformities, no ulcerations, no other skin breakdown bilaterally:  Yes    Sensation Testing  Intact to touch and monofilament testing bilaterally:  Yes  Pulse Check  Posterior Tibialis and Dorsalis pulse intact bilaterally:  Yes  Comments      Assessment/plan: Brandy King is a 50 y.o. female present for annual exam.  1. Diabetes mellitus without complication ([place]) - 123456 5.9 today, great control. Tolerating low-dose metformin. Making great dietary and exercise changes. - POCT glycosylated hemoglobin (Hb A1C) - encourage patient to make ophthalmology appointment - Foot exam completed  2. Vitamin D deficiency - Repeat vitamin D, patient encourage to take at least 800 units of vitamin D supplementation daily with a meal. If needed will increase supplementation based on new vitamin D result. - VITAMIN D 25 Hydroxy (Vit-D Deficiency, Fractures)  3. Immunization due - Tdap vaccine greater than or equal to 7yo IM  4. Preventative health examination: Colonoscopy: No family history, routine screening at age 36. Referral placed today. Mammogram: Completed 12/2014. BI-RADS 1. No family history. Routine screening. Mammogram ordered at Breast center today. Cervical cancer screening: UTD reports 1.5 years ago. PAP indicated in 2018. Immunizations: Flu shot not indicated at this time, tetanus immunization provided today. Awaiting records to see if timing and need of pneumococcal vaccinations. Infectious disease screening: Unknown, records have been requested. DEXA: Unknown, records requested Assistive devices: None Oxygen use: N/A Patient has a Dental home. Hospitalizations/ED visits: Reviewed Patient encouraged to make an appointment with her ophthalmologist  3 months for a1c and lipids   Patient was encouraged to exercise greater than 150 minutes a week. Patient was encouraged to choose a diet filled with fresh fruits and vegetables, and lean meats. AVS provided to patient today for education/recommendation on gender specific health and safety  maintenance. Return in about 3 months (around 05/16/2016) for htn/lipids/a1c.  Electronically signed by: Howard Pouch, DO [place]

## 2016-02-12 NOTE — Patient Instructions (Addendum)
Health Maintenance, Female Adopting a healthy lifestyle and getting preventive care can go a long way to promote health and wellness. Talk with your health care provider about what schedule of regular examinations is right for you. This is a good chance for you to check in with your provider about disease prevention and staying healthy. In between checkups, there are plenty of things you can do on your own. Experts have done a lot of research about which lifestyle changes and preventive measures are most likely to keep you healthy. Ask your health care provider for more information. WEIGHT AND DIET  Eat a healthy diet  Be sure to include plenty of vegetables, fruits, low-fat dairy products, and lean protein.  Do not eat a lot of foods high in solid fats, added sugars, or salt.  Get regular exercise. This is one of the most important things you can do for your health.  Most adults should exercise for at least 150 minutes each week. The exercise should increase your heart rate and make you sweat (moderate-intensity exercise).  Most adults should also do strengthening exercises at least twice a week. This is in addition to the moderate-intensity exercise.  Maintain a healthy weight  Body mass index (BMI) is a measurement that can be used to identify possible weight problems. It estimates body fat based on height and weight. Your health care provider can help determine your BMI and help you achieve or maintain a healthy weight.  For females 20 years of age and older:   A BMI below 18.5 is considered underweight.  A BMI of 18.5 to 24.9 is normal.  A BMI of 25 to 29.9 is considered overweight.  A BMI of 30 and above is considered obese.  Watch levels of cholesterol and blood lipids  You should start having your blood tested for lipids and cholesterol at 50 years of age, then have this test every 5 years.  You may need to have your cholesterol levels checked more often if:  Your lipid  or cholesterol levels are high.  You are older than 50 years of age.  You are at high risk for heart disease.  CANCER SCREENING   Lung Cancer  Lung cancer screening is recommended for adults 55-80 years old who are at high risk for lung cancer because of a history of smoking.  A yearly low-dose CT scan of the lungs is recommended for people who:  Currently smoke.  Have quit within the past 15 years.  Have at least a 30-pack-year history of smoking. A pack year is smoking an average of one pack of cigarettes a day for 1 year.  Yearly screening should continue until it has been 15 years since you quit.  Yearly screening should stop if you develop a health problem that would prevent you from having lung cancer treatment.  Breast Cancer  Practice breast self-awareness. This means understanding how your breasts normally appear and feel.  It also means doing regular breast self-exams. Let your health care provider know about any changes, no matter how small.  If you are in your 20s or 30s, you should have a clinical breast exam (CBE) by a health care provider every 1-3 years as part of a regular health exam.  If you are 40 or older, have a CBE every year. Also consider having a breast X-ray (mammogram) every year.  If you have a family history of breast cancer, talk to your health care provider about genetic screening.  If you   are at high risk for breast cancer, talk to your health care provider about having an MRI and a mammogram every year.  Breast cancer gene (BRCA) assessment is recommended for women who have family members with BRCA-related cancers. BRCA-related cancers include:  Breast.  Ovarian.  Tubal.  Peritoneal cancers.  Results of the assessment will determine the need for genetic counseling and BRCA1 and BRCA2 testing. Cervical Cancer Your health care provider may recommend that you be screened regularly for cancer of the pelvic organs (ovaries, uterus, and  vagina). This screening involves a pelvic examination, including checking for microscopic changes to the surface of your cervix (Pap test). You may be encouraged to have this screening done every 3 years, beginning at age 21.  For women ages 30-65, health care providers may recommend pelvic exams and Pap testing every 3 years, or they may recommend the Pap and pelvic exam, combined with testing for human papilloma virus (HPV), every 5 years. Some types of HPV increase your risk of cervical cancer. Testing for HPV may also be done on women of any age with unclear Pap test results.  Other health care providers may not recommend any screening for nonpregnant women who are considered low risk for pelvic cancer and who do not have symptoms. Ask your health care provider if a screening pelvic exam is right for you.  If you have had past treatment for cervical cancer or a condition that could lead to cancer, you need Pap tests and screening for cancer for at least 20 years after your treatment. If Pap tests have been discontinued, your risk factors (such as having a new sexual partner) need to be reassessed to determine if screening should resume. Some women have medical problems that increase the chance of getting cervical cancer. In these cases, your health care provider may recommend more frequent screening and Pap tests. Colorectal Cancer  This type of cancer can be detected and often prevented.  Routine colorectal cancer screening usually begins at 50 years of age and continues through 50 years of age.  Your health care provider may recommend screening at an earlier age if you have risk factors for colon cancer.  Your health care provider may also recommend using home test kits to check for hidden blood in the stool.  A small camera at the end of a tube can be used to examine your colon directly (sigmoidoscopy or colonoscopy). This is done to check for the earliest forms of colorectal  cancer.  Routine screening usually begins at age 50.  Direct examination of the colon should be repeated every 5-10 years through 50 years of age. However, you may need to be screened more often if early forms of precancerous polyps or small growths are found. Skin Cancer  Check your skin from head to toe regularly.  Tell your health care provider about any new moles or changes in moles, especially if there is a change in a mole's shape or color.  Also tell your health care provider if you have a mole that is larger than the size of a pencil eraser.  Always use sunscreen. Apply sunscreen liberally and repeatedly throughout the day.  Protect yourself by wearing long sleeves, pants, a wide-brimmed hat, and sunglasses whenever you are outside. HEART DISEASE, DIABETES, AND HIGH BLOOD PRESSURE   High blood pressure causes heart disease and increases the risk of stroke. High blood pressure is more likely to develop in:  People who have blood pressure in the high end   of the normal range (130-139/85-89 mm Hg).  People who are overweight or obese.  People who are African American.  If you are 38-23 years of age, have your blood pressure checked every 3-5 years. If you are 61 years of age or older, have your blood pressure checked every year. You should have your blood pressure measured twice--once when you are at a hospital or clinic, and once when you are not at a hospital or clinic. Record the average of the two measurements. To check your blood pressure when you are not at a hospital or clinic, you can use:  An automated blood pressure machine at a pharmacy.  A home blood pressure monitor.  If you are between 45 years and 39 years old, ask your health care provider if you should take aspirin to prevent strokes.  Have regular diabetes screenings. This involves taking a blood sample to check your fasting blood sugar level.  If you are at a normal weight and have a low risk for diabetes,  have this test once every three years after 50 years of age.  If you are overweight and have a high risk for diabetes, consider being tested at a younger age or more often. PREVENTING INFECTION  Hepatitis B  If you have a higher risk for hepatitis B, you should be screened for this virus. You are considered at high risk for hepatitis B if:  You were born in a country where hepatitis B is common. Ask your health care provider which countries are considered high risk.  Your parents were born in a high-risk country, and you have not been immunized against hepatitis B (hepatitis B vaccine).  You have HIV or AIDS.  You use needles to inject street drugs.  You live with someone who has hepatitis B.  You have had sex with someone who has hepatitis B.  You get hemodialysis treatment.  You take certain medicines for conditions, including cancer, organ transplantation, and autoimmune conditions. Hepatitis C  Blood testing is recommended for:  Everyone born from [birth year] through 1965.  Anyone with known risk factors for hepatitis C. Sexually transmitted infections (STIs)  You should be screened for sexually transmitted infections (STIs) including gonorrhea and chlamydia if:  You are sexually active and are younger than 50 years of age.  You are older than 50 years of age and your health care provider tells you that you are at risk for this type of infection.  Your sexual activity has changed since you were last screened and you are at an increased risk for chlamydia or gonorrhea. Ask your health care provider if you are at risk.  If you do not have HIV, but are at risk, it may be recommended that you take a prescription medicine daily to prevent HIV infection. This is called pre-exposure prophylaxis (PrEP). You are considered at risk if:  You are sexually active and do not regularly use condoms or know the HIV status of your partner(s).  You take drugs by injection.  You are sexually  active with a partner who has HIV. Talk with your health care provider about whether you are at high risk of being infected with HIV. If you choose to begin PrEP, you should first be tested for HIV. You should then be tested every 3 months for as long as you are taking PrEP.  PREGNANCY   If you are premenopausal and you may become pregnant, ask your health care provider about preconception counseling.  If you may  become pregnant, take 400 to 800 micrograms (mcg) of folic acid every day.  If you want to prevent pregnancy, talk to your health care provider about birth control (contraception). OSTEOPOROSIS AND MENOPAUSE   Osteoporosis is a disease in which the bones lose minerals and strength with aging. This can result in serious bone fractures. Your risk for osteoporosis can be identified using a bone density scan.  If you are 20 years of age or older, or if you are at risk for osteoporosis and fractures, ask your health care provider if you should be screened.  Ask your health care provider whether you should take a calcium or vitamin D supplement to lower your risk for osteoporosis.  Menopause may have certain physical symptoms and risks.  Hormone replacement therapy may reduce some of these symptoms and risks. Talk to your health care provider about whether hormone replacement therapy is right for you.  HOME CARE INSTRUCTIONS   Schedule regular health, dental, and eye exams.  Stay current with your immunizations.   Do not use any tobacco products including cigarettes, chewing tobacco, or electronic cigarettes.  If you are pregnant, do not drink alcohol.  If you are breastfeeding, limit how much and how often you drink alcohol.  Limit alcohol intake to no more than 1 drink per day for nonpregnant women. One drink equals 12 ounces of beer, 5 ounces of wine, or 1 ounces of hard liquor.  Do not use street drugs.  Do not share needles.  Ask your health care provider for help if  you need support or information about quitting drugs.  Tell your health care provider if you often feel depressed.  Tell your health care provider if you have ever been abused or do not feel safe at home.   This information is not intended to replace advice given to you by your health care provider. Make sure you discuss any questions you have with your health care provider.   Document Released: 04/25/2011 Document Revised: 10/31/2014 Document Reviewed: 09/11/2013 Elsevier Interactive Patient Education [employer].    3 month follow up  I will schedule your colonoscopy and mammogram referral and you will be called.

## 2016-02-13 LAB — VITAMIN D 25 HYDROXY (VIT D DEFICIENCY, FRACTURES): VIT D 25 HYDROXY: 53.9 ng/mL (ref 30.0–100.0)

## 2016-02-15 ENCOUNTER — Telehealth: Payer: Self-pay | Admitting: Family Medicine

## 2016-02-15 DIAGNOSIS — Z1211 Encounter for screening for malignant neoplasm of colon: Secondary | ICD-10-CM | POA: Insufficient documentation

## 2016-02-15 DIAGNOSIS — Z1231 Encounter for screening mammogram for malignant neoplasm of breast: Secondary | ICD-10-CM | POA: Insufficient documentation

## 2016-02-15 DIAGNOSIS — Z0000 Encounter for general adult medical examination without abnormal findings: Secondary | ICD-10-CM | POA: Insufficient documentation

## 2016-02-15 NOTE — Telephone Encounter (Signed)
Left message with results and instructions on patient voice mail. 

## 2016-02-15 NOTE — Telephone Encounter (Signed)
Please call pt her vitamin D is great. - Please have her continue over-the-counter supplementation 800 units vitamin D daily with a meal.

## 2016-03-18 ENCOUNTER — Telehealth: Payer: Self-pay | Admitting: Family Medicine

## 2016-03-18 NOTE — Telephone Encounter (Signed)
Patient would like to discontinue Venlafaxine. Patient advised that physician out until 5/30. She is requesting a CB when she returns on the 30th. Thanks

## 2016-03-22 NOTE — Telephone Encounter (Signed)
Left message for patient to call back to review instructions. °

## 2016-03-22 NOTE — Telephone Encounter (Signed)
Received a message patient would like to discontinue her Effexor. Please advised her to take medication every other day for 14 days, then reduce medication to once every 3 days for 1 week, then she should be able to discontinue medication. Some people experience headaches, dizziness with discontinuation of medicine if done too quickly.

## 2016-03-23 NOTE — Telephone Encounter (Signed)
Left message for patient to return call.

## 2016-03-28 NOTE — Telephone Encounter (Signed)
Left message for patient to return call.

## 2016-03-29 NOTE — Telephone Encounter (Signed)
Tried to contact patient on numerous occasions left messages . Patient has not returned call.

## 2016-05-19 ENCOUNTER — Ambulatory Visit: Payer: 59 | Admitting: Family Medicine

## 2016-06-15 ENCOUNTER — Ambulatory Visit (INDEPENDENT_AMBULATORY_CARE_PROVIDER_SITE_OTHER): Payer: Self-pay | Admitting: Family Medicine

## 2016-06-15 ENCOUNTER — Encounter: Payer: Self-pay | Admitting: Family Medicine

## 2016-06-15 VITALS — BP 138/85 | HR 62 | Temp 97.8°F | Resp 20 | Ht 68.0 in | Wt 332.8 lb

## 2016-06-15 DIAGNOSIS — E119 Type 2 diabetes mellitus without complications: Secondary | ICD-10-CM

## 2016-06-15 DIAGNOSIS — I10 Essential (primary) hypertension: Secondary | ICD-10-CM

## 2016-06-15 LAB — POCT GLYCOSYLATED HEMOGLOBIN (HGB A1C): Hemoglobin A1C: 6.1

## 2016-06-15 NOTE — Progress Notes (Addendum)
Patient ID: Brandy King, female   DOB: 12-Jan-1966, 50 y.o.   MRN: [id number]      Patient ID: Brandy King, female  DOB: 24-Mar-1966, 50 y.o.   MRN: [id number]  Subjective:  Brandy King is a 50 y.o. female present for diabetes and hypertension follow-up  Diabetes: Last a1c 6.3--> 5.9--> 6.1 today.  Body mass index is 50.59 kg/m. Started on metformin low dose and is tolerated well. She denies any negative GI side effects, she has been exercising, and attempting a lower carbohydrate/sugar diet.   Elevated cholesterol: She has had elevated cholesterol in the past. She has a family history of hyperlipidemia and heart disease. She is morbidly obese. She is currently not on any medications for her cholesterol. She is a diabetic. She was started on fish oil supplementation January 2017. She would like to wait another 6 months to have her cholesterol rechecked secondary to insurance purposes. She reports compliance with her fish oil supplementation is tolerating well.   Hypertension: Patient reports compliance with her this is bb/HCTZ. She does admit to some mild lower extremity edema. She denies chest pain, shortness of breath, dizziness, syncope or headaches. She is attempting to exercise routinely. She attempts to watch the salt content in her diet.   Past Medical History:  Diagnosis Date  . Allergy   . Anxiety   . Depression   . History of chicken pox   . Hypertension   . Migraine   . Prediabetes    Allergies  Allergen Reactions  . Asa [Aspirin] Other (See Comments)    swelling  . Ibuprofen Other (See Comments)    swelling  . Prozac [Fluoxetine Hcl]    Past Surgical History:  Procedure Laterality Date  . APPENDECTOMY    . CESAREAN SECTION  1988  . CHOLECYSTECTOMY    . cholestecomy  1977  . ENDOMETRIAL ABLATION     Dr Ouida Sills 2005  . TONSILLECTOMY AND ADENOIDECTOMY     Family History  Problem Relation Age of Onset  . Diabetes Mother   . Diabetes Father   . Heart disease  Father   . Arthritis Father   . Cancer Father     Blood cancer (unknown)   . Cancer Paternal Aunt     bladder  . Cancer Paternal Uncle     sternal tumor/kidney cancer   Social History   Social History  . Marital status: Married    Spouse name: N/A  . Number of children: N/A  . Years of education: N/A   Occupational History  . Not on file.   Social History Main Topics  . Smoking status: Never Smoker  . Smokeless tobacco: Never Used  . Alcohol use No  . Drug use: No  . Sexual activity: Yes    Birth control/ protection: Surgical     Comment: vasectomy, husband   Other Topics Concern  . Not on file   Social History Narrative   Married, Lanny Hurst, 4 children.    Internet sales, HS diploma.    Patient does not drink caffeinated beverage   Patient uses herbal remedies. Patient wears her seatbelt. Patient wears a bike helmet.   There is a smoke detector in her home. There are firearms in her home.   She feels safe in her relationship.        Medication List       Accurate as of 06/15/16  9:22 AM. Always use your most recent med list.  bisoprolol-hydrochlorothiazide 10-6.25 MG tablet Commonly known as:  ZIAC Take 1 tablet by mouth daily.   metFORMIN 500 MG tablet Commonly known as:  GLUCOPHAGE 500 mg BID   omeprazole 20 MG capsule Commonly known as:  PRILOSEC Take 1 capsule (20 mg total) by mouth daily.   UNABLE TO FIND Apply 2 drops topically. estadiol 1mg /ml lipoderm       ROS: Negative, with the exception of above mentioned in HPI  Objective: BP 138/85 (BP Location: Right Arm, Patient Position: Sitting, Cuff Size: Large)   Pulse 62   Temp 97.8 F (36.6 C)   Resp 20   Ht 5\' 8"  (1.727 m)   Wt (!) 332 lb 12 oz (150.9 kg)   SpO2 95%   BMI 50.59 kg/m  Gen: Afebrile. No acute distress. Nontoxic in appearance, well-developed, well-nourished, female, Obese female HENT: AT. [place]. Marland Kitchen MMM Eyes:Pupils Equal Round Reactive to light, Extraocular  movements intact,  Conjunctiva without redness, discharge or icterus. Neck/lymp/endocrine: Supple, no lymphadenopathy, no thyromegaly CV: RRR no murmur appreciated, no edema, +2/4 P posterior tibialis pulses.  Chest: CTAB, no wheeze, rhonchi or crackles.. Abd: Soft. Obese. NTND. BS present.  Skin: No rashes, purpura or petechiae. Warm and well-perfused. Skin intact. Neuro/Msk:  Normal gait. PERLA. EOMi. Alert. Oriented x3.   Psych: Normal affect, dress and demeanor. Normal speech. Normal thought content and judgment.   Assessment/plan: Brandy King is a 50 y.o. female present for annual exam.  Diabetes mellitus without complication ([place]) - 123456 5.9 --> 6.1 today, great control.  - Tolerating low-dose metformin. Continue metformin 500 mg twice a day. Encouraged her to continue watching diet and exercising more frequently. - encourage patient to make ophthalmology appointment. She has not been able to do so secondary to financial reasons. - Foot exam Is up-to-date - declined PNA For now secondary to financial reasons - microalbumin collect next visit. - Follow-up in 6 months with lipid panel as well.  HTN:  - Blood pressure is borderline, would like patient to have a little bit more control. She does complain of mildly increased lower extremity edema. - Continue his bisoprolol/HCTZ 10-6 0.25 - HCTZ 12.5 add on.  - Nurse visit one week for blood pressure recheck - Low-salt diet encouraged/increase exercise encouraged - Follow-up 6 months  Follow-up in one week for nurse visit on BP recheck Every six-month chronic condition follow-up Yearly physical  Electronically signed by: Howard Pouch, [place]

## 2016-06-15 NOTE — Patient Instructions (Signed)
I will call in 12.5 HCTZ to add on to your BP regimen.  You will need a nurse appt in 1 week to recheck BP.

## 2016-06-21 ENCOUNTER — Telehealth: Payer: Self-pay | Admitting: Family Medicine

## 2016-06-21 MED ORDER — BISOPROLOL-HYDROCHLOROTHIAZIDE 10-6.25 MG PO TABS
1.0000 | ORAL_TABLET | Freq: Every day | ORAL | 1 refills | Status: AC
Start: 2016-06-21 — End: ?

## 2016-06-21 NOTE — Telephone Encounter (Signed)
Patient cancelled bp check for tomorrow. She needs to get her fluid pill Rx filled first. Please check on Rx & contact patient. Thanks

## 2016-06-21 NOTE — Telephone Encounter (Signed)
Refill sent.

## 2016-08-26 ENCOUNTER — Ambulatory Visit (INDEPENDENT_AMBULATORY_CARE_PROVIDER_SITE_OTHER): Payer: Self-pay | Admitting: Family Medicine

## 2016-08-26 ENCOUNTER — Encounter: Payer: Self-pay | Admitting: Family Medicine

## 2016-08-26 VITALS — BP 139/80 | HR 65 | Temp 98.0°F | Resp 20 | Ht 68.0 in | Wt 328.0 lb

## 2016-08-26 DIAGNOSIS — R229 Localized swelling, mass and lump, unspecified: Secondary | ICD-10-CM

## 2016-08-26 DIAGNOSIS — IMO0002 Reserved for concepts with insufficient information to code with codable children: Secondary | ICD-10-CM

## 2016-08-26 NOTE — Patient Instructions (Signed)
I have referred you to General surgery to have this evaluated and removed.   Try to avoid direct pressure, and if it becomes red, more swelling or fever occurs please be seen immediately.

## 2016-08-26 NOTE — Progress Notes (Signed)
Brandy King , [birth year], 50 y.o., female MRN: [id number] Patient Care Team    Relationship Specialty Notifications Start End  Ma Hillock, DO PCP - General Family Medicine  11/04/15     CC: painful cyst Subjective: Pt presents for an acute OV with complaints of painful cyst  of  Upper back/neck area. She states she has had a cyst there for over a year. It has never bothered her, so she did not have it remove. It has slowly grown over that time and over the last week she feels direct pressure/laying on it, it has started to cause pain. She denies fever, chills, nausea, redness, drainage or erythema.   Allergies  Allergen Reactions  . Asa [Aspirin] Other (See Comments)    swelling  . Ibuprofen Other (See Comments)    swelling  . Prozac [Fluoxetine Hcl]    Social History  Substance Use Topics  . Smoking status: Never Smoker  . Smokeless tobacco: Never Used  . Alcohol use No   Past Medical History:  Diagnosis Date  . Allergy   . Anxiety   . Depression   . History of chicken pox   . Hypertension   . Migraine   . Prediabetes    Past Surgical History:  Procedure Laterality Date  . APPENDECTOMY    . CESAREAN SECTION  1988  . CHOLECYSTECTOMY    . cholestecomy  1977  . ENDOMETRIAL ABLATION     Dr Ouida Sills 2005  . TONSILLECTOMY AND ADENOIDECTOMY     Family History  Problem Relation Age of Onset  . Diabetes Mother   . Diabetes Father   . Heart disease Father   . Arthritis Father   . Cancer Father     Blood cancer (unknown)   . Cancer Paternal Aunt     bladder  . Cancer Paternal Uncle     sternal tumor/kidney cancer     Medication List       Accurate as of 08/26/16  9:13 AM. Always use your most recent med list.          bisoprolol-hydrochlorothiazide 10-6.25 MG tablet Commonly known as:  ZIAC Take 1 tablet by mouth daily.   metFORMIN 500 MG tablet Commonly known as:  GLUCOPHAGE 500 mg BID   omeprazole 20 MG capsule Commonly known as:   PRILOSEC Take 1 capsule (20 mg total) by mouth daily.   UNABLE TO FIND Apply 2 drops topically. estadiol 1mg /ml lipoderm       No results found for this or any previous visit (from the past 24 hour(s)). No results found.   ROS: Negative, with the exception of above mentioned in HPI   Objective:  There were no vitals taken for this visit. There is no height or weight on file to calculate BMI. Gen: Afebrile. No acute distress. Nontoxic in appearance, well developed, well nourished. Obese caucasian female. Every pleasant. HENT: AT. [place]. MMM Eyes:Pupils Equal Round Reactive to light, Extraocular movements intact,  Conjunctiva without redness, discharge or icterus. Skin: no erythema, drainage. Skin intact. 12 X 7 cm mass midline cervical region to right upper trap region ~ C6 area. Not tender to palpate.  Assessment/Plan: Brandy King is a 50 y.o. female present for acute OV for  Mass - discussed with patient a mass this size needs surgical removal. It feels cystic, but could be a lipoma. Not infectious appearing. Pt agreeable to surgical referral.  - Return precautions discussed - Ambulatory referral to General Surgery -  F/U PRN    electronically signed by:  Howard Pouch, DO  [place]

## 2016-09-05 ENCOUNTER — Ambulatory Visit: Payer: Self-pay | Admitting: Surgery

## 2016-09-05 NOTE — H&P (Unsigned)
History of Present Illness Imogene Burn. Ermin Parisien MD; 09/05/2016 1:20 PM) The patient is a 50 year old female who presents with a complaint of Mass. This is a 50 year old female referred by Dr. Raoul Pitch for evaluation of an enlarging mass on her upper back near the base of her neck. This has been present for about a year and a half and has slowly enlarged. It is causing some significant discomfort with pain radiating up into the base of her neck and occasionally causing headaches. This area has never been infected. No imaging studies. She presents now to discuss excision.   Other Problems Erasmo Leventhal, RN, BSN; 09/05/2016 9:56 AM) Anxiety Disorder Back Pain Cholelithiasis Depression Gastroesophageal Reflux Disease High blood pressure Hypercholesterolemia Migraine Headache  Past Surgical History Erasmo Leventhal, RN, BSN; 09/05/2016 9:56 AM) Cesarean Section - 1 Gallbladder Surgery - Laparoscopic Tonsillectomy  Diagnostic Studies History Erasmo Leventhal, RN, BSN; 09/05/2016 9:56 AM) Colonoscopy never Mammogram 1-3 years ago Pap Smear 1-5 years ago  Allergies Erasmo Leventhal, RN, BSN; 09/05/2016 9:59 AM) Nelwyn Salisbury *ANALGESICS - NonNarcotic* Ibuprofen *ANALGESICS - ANTI-INFLAMMATORY* PROzac *ANTIDEPRESSANTS*  Medication History (Crystal Dollard, RN, BSN; 09/05/2016 10:02 AM) Bisoprolol-Hydrochlorothiazide (10-6.25MG  Tablet, Oral every other day) Active. MetFORMIN HCl (500MG  Tablet, Oral) Active. PriLOSEC (20MG  Capsule DR, Oral daily) Active. Estrogens, Conjugated (0.625MG /GM Cream, Vaginal as needed) Active. Medications Reconciled  Social History Multimedia programmer, RN, BSN; 09/05/2016 9:56 AM) Caffeine use Carbonated beverages, Tea. No alcohol use No drug use Tobacco use Never smoker.  Family History (Erasmo Leventhal, RN, BSN; 09/05/2016 9:56 AM) Arthritis Father. Cancer Father. Colon Polyps Father. Depression Mother. Diabetes Mellitus  Mother. Heart Disease Father. Hypertension Brother, Father, Mother.  Pregnancy / Birth History Erasmo Leventhal, RN, BSN; 09/05/2016 9:56 AM) Age at menarche 70 years. Gravida 5 Irregular periods Length (months) of breastfeeding 3-6 Maternal age 42-20 Para 4     Review of Systems Occupational hygienist, BSN; 09/05/2016 9:56 AM) General Not Present- Appetite Loss, Chills, Fatigue, Fever, Night Sweats, Weight Gain and Weight Loss. Skin Not Present- Change in Wart/Mole, Dryness, Hives, Jaundice, New Lesions, Non-Healing Wounds, Rash and Ulcer. HEENT Not Present- Earache, Hearing Loss, Hoarseness, Nose Bleed, Oral Ulcers, Ringing in the Ears, Seasonal Allergies, Sinus Pain, Sore Throat, Visual Disturbances, Wears glasses/contact lenses and Yellow Eyes. Respiratory Not Present- Bloody sputum, Chronic Cough, Difficulty Breathing, Snoring and Wheezing. Breast Not Present- Breast Mass, Breast Pain, Nipple Discharge and Skin Changes. Cardiovascular Present- Swelling of Extremities. Not Present- Chest Pain, Difficulty Breathing Lying Down, Leg Cramps, Palpitations, Rapid Heart Rate and Shortness of Breath. Gastrointestinal Not Present- Abdominal Pain, Bloating, Bloody Stool, Change in Bowel Habits, Chronic diarrhea, Constipation, Difficulty Swallowing, Excessive gas, Gets full quickly at meals, Hemorrhoids, Indigestion, Nausea, Rectal Pain and Vomiting. Female Genitourinary Not Present- Frequency, Nocturia, Painful Urination, Pelvic Pain and Urgency. Musculoskeletal Not Present- Back Pain, Joint Pain, Joint Stiffness, Muscle Pain, Muscle Weakness and Swelling of Extremities. Neurological Not Present- Decreased Memory, Fainting, Headaches, Numbness, Seizures, Tingling, Tremor, Trouble walking and Weakness. Psychiatric Present- Anxiety. Not Present- Bipolar, Change in Sleep Pattern, Depression, Fearful and Frequent crying. Endocrine Present- Hot flashes. Not Present- Cold Intolerance, Excessive  Hunger, Hair Changes, Heat Intolerance and New Diabetes. Hematology Not Present- Blood Thinners, Easy Bruising, Excessive bleeding, Gland problems, HIV and Persistent Infections.  Vitals Occupational hygienist, BSN; 09/05/2016 9:59 AM) 09/05/2016 9:58 AM Weight: 326.8 lb Height: 67in Body Surface Area: 2.49 m Body Mass Index: 51.18 kg/m  Temp.: 98.14F(Oral)  Pulse: 78 (Regular)  BP: 120/76 (Sitting, Left Arm, Standard)  Physical Exam Rodman Key K. [place] Spees MD; 09/05/2016 1:20 PM)  The physical exam findings are as follows: Note:Obese female in NAD Eyes: Pupils equal, round; sclera anicteric HENT: Oral mucosa moist; good dentition Neck: No masses palpated, no thyromegaly Back: below base of neck - 4 cm protruding subcutaneous mass - smooth, well-demarcated; no sign of inflammation Lungs: CTA bilaterally; normal respiratory effort CV: Regular rate and rhythm; no murmurs; extremities well-perfused with no edema Abd: +bowel sounds, soft, non-tender, no palpable organomegaly; no palpable hernias Skin: Warm, dry; no sign of jaundice Psychiatric - alert and oriented x 4; calm mood and affect    Assessment & Plan Rodman Key K. Jalaysia Lobb MD; 09/05/2016 10:19 AM)  LIPOMA OF BACK (D17.1)  Current Plans Schedule for Surgery - Excision of subcutaneous mass - upper back. The surgical procedure has been discussed with the patient. Potential risks, benefits, alternative treatments, and expected outcomes have been explained. All of the patient's questions at this time have been answered. The likelihood of reaching the patient's treatment goal is good. The patient understand the proposed surgical procedure and wishes to proceed.  Imogene Burn. Georgette Dover, MD, [facility] Surgery  General/ Trauma Surgery  09/05/2016 1:21 PM

## 2016-10-18 NOTE — Pre-Procedure Instructions (Signed)
Brandy King  10/18/2016      [id number] - [place], [place] - [address] [place] [address] Phone: [phone] Fax: [phone]  [facility] [id number], [place] [address] Phone: [phone] Fax: [phone]    Your procedure is scheduled on January 3  Report to [facility] Admitting at 0800 A.M.  Call this number if you have problems the morning of surgery:  [phone]   Remember:  Do not eat food or drink liquids after midnight.   Take these medicines the morning of surgery with A SIP OF WATER Tylenol if needed, Celexa, Klonopin if needed, progesterone  7 days prior to surgery STOP taking any Aspirin, Aleve, Naproxen, Ibuprofen, Motrin, Advil, Goody's, BC's, all herbal medications, fish oil, and all vitamins    Do not wear jewelry, make-up or nail polish.  Do not wear lotions, powders, or perfumes, or deoderant.  Do not shave 48 hours prior to surgery.    Do not bring valuables to the hospital.  [facility] is not responsible for any belongings or valuables.  Contacts, dentures or bridgework may not be worn into surgery.  Leave your suitcase in the car.  After surgery it may be brought to your room.  For patients admitted to the hospital, discharge time will be determined by your treatment team.  Patients discharged the day of surgery will not be allowed to drive home.    Special instructions:   [facility]- Preparing For Surgery  Before surgery, you can play an important role. Because skin is not sterile, your skin needs to be as free of germs as possible. You can reduce the number of germs on your skin by washing with CHG (chlorahexidine gluconate) Soap before surgery.  CHG is an antiseptic cleaner which kills germs and bonds with the skin to continue killing germs even after washing.  Please do not use if you have an allergy to  CHG or antibacterial soaps. If your skin becomes reddened/irritated stop using the CHG.  Do not shave (including legs and underarms) for at least 48 hours prior to first CHG shower. It is OK to shave your face.  Please follow these instructions carefully.   1. Shower the NIGHT BEFORE SURGERY and the MORNING OF SURGERY with CHG.   2. If you chose to wash your hair, wash your hair first as usual with your normal shampoo.  3. After you shampoo, rinse your hair and body thoroughly to remove the shampoo.  4. Use CHG as you would any other liquid soap. You can apply CHG directly to the skin and wash gently with a scrungie or a clean washcloth.   5. Apply the CHG Soap to your body ONLY FROM THE NECK DOWN.  Do not use on open wounds or open sores. Avoid contact with your eyes, ears, mouth and genitals (private parts). Wash genitals (private parts) with your normal soap.  6. Wash thoroughly, paying special attention to the area where your surgery will be performed.  7. Thoroughly rinse your body with warm water from the neck down.  8. DO NOT shower/wash with your normal soap after using and rinsing off the CHG Soap.  9. Pat yourself dry with a CLEAN TOWEL.   10. Wear CLEAN PAJAMAS   11. Place CLEAN SHEETS on your bed the night of your first shower and DO  NOT SLEEP WITH PETS.    Day of Surgery: Do not apply any deodorants/lotions. Please wear clean clothes to the hospital/surgery center.      Please read over the following fact sheets that you were given.

## 2016-10-19 ENCOUNTER — Encounter (HOSPITAL_COMMUNITY): Payer: Self-pay

## 2016-10-19 ENCOUNTER — Encounter (HOSPITAL_COMMUNITY)
Admission: RE | Admit: 2016-10-19 | Discharge: 2016-10-19 | Disposition: A | Discharge status: Home / Self Care | Payer: Self-pay | Source: Ambulatory Visit | Attending: Surgery | Admitting: Surgery

## 2016-10-19 DIAGNOSIS — I517 Cardiomegaly: Secondary | ICD-10-CM | POA: Insufficient documentation

## 2016-10-19 DIAGNOSIS — Z01812 Encounter for preprocedural laboratory examination: Secondary | ICD-10-CM | POA: Insufficient documentation

## 2016-10-19 DIAGNOSIS — R9431 Abnormal electrocardiogram [ECG] [EKG]: Secondary | ICD-10-CM | POA: Insufficient documentation

## 2016-10-19 DIAGNOSIS — Z01810 Encounter for preprocedural cardiovascular examination: Secondary | ICD-10-CM | POA: Insufficient documentation

## 2016-10-19 HISTORY — DX: Unspecified osteoarthritis, unspecified site: M19.90

## 2016-10-19 HISTORY — DX: Other specified postprocedural states: R11.2

## 2016-10-19 HISTORY — DX: Nausea with vomiting, unspecified: Z98.890

## 2016-10-19 HISTORY — DX: Adverse effect of unspecified anesthetic, initial encounter: T41.45XA

## 2016-10-19 HISTORY — DX: Other complications of anesthesia, initial encounter: T88.59XA

## 2016-10-19 HISTORY — DX: Prediabetes: R73.03

## 2016-10-19 HISTORY — DX: Cardiac arrhythmia, unspecified: I49.9

## 2016-10-19 LAB — CBC
HCT: 37.3 % (ref 36.0–46.0)
HEMOGLOBIN: 12.5 g/dL (ref 12.0–15.0)
MCH: 28 pg (ref 26.0–34.0)
MCHC: 33.5 g/dL (ref 30.0–36.0)
MCV: 83.4 fL (ref 78.0–100.0)
Platelets: 238 10*3/uL (ref 150–400)
RBC: 4.47 MIL/uL (ref 3.87–5.11)
RDW: 14.6 % (ref 11.5–15.5)
WBC: 9.5 10*3/uL (ref 4.0–10.5)

## 2016-10-19 LAB — BASIC METABOLIC PANEL
ANION GAP: 8 (ref 5–15)
BUN: 9 mg/dL (ref 6–20)
CALCIUM: 9.3 mg/dL (ref 8.9–10.3)
CHLORIDE: 105 mmol/L (ref 101–111)
CO2: 26 mmol/L (ref 22–32)
Creatinine, Ser: 0.75 mg/dL (ref 0.44–1.00)
GFR calc non Af Amer: >60 mL/min (ref >60)
Glucose, Bld: 115 mg/dL — ABNORMAL HIGH (ref 65–99)
Potassium: 3.5 mmol/L (ref 3.5–5.1)
Sodium: 139 mmol/L (ref 135–145)

## 2016-10-19 LAB — GLUCOSE, CAPILLARY: Glucose-Capillary: 127 mg/dL — ABNORMAL HIGH (ref 65–99)

## 2016-10-19 LAB — HCG, SERUM, QUALITATIVE: PREG SERUM: NEGATIVE

## 2016-10-19 NOTE — Progress Notes (Signed)
Pt. Reports that when she was being seen by [place] she was started on Metformin for prediabetes but had such severe diarrhea she had to stop med.  [facility] Reported to her that her HgbA1c was ok She didn't need to start on anything else.

## 2016-10-19 NOTE — Progress Notes (Signed)
Pt. Reports that she had some cardiac testing approx. 10 yrs. Ago, unknown reason, states it was with [facility]- stress , echo & wore Holter monitor. Told that she has an extra beat now & then in the ventricle. Pt. Told to f/u only if she had a problem, or further worry about changes.  Pt. Reports that she has panic attacks, uses Klonopin if needed.   Pt. Was being seen for PCP by [facility] but has recently  returned to [facility]. Pt. Had uterine ablation, but still has spotting on occasion.

## 2016-10-25 MED ORDER — DEXTROSE 5 % IV SOLN
3.0000 g | INTRAVENOUS | Status: AC
  Administered 2016-10-26: 3 g via INTRAVENOUS
  Filled 2016-10-25: qty 3000

## 2016-10-26 ENCOUNTER — Ambulatory Visit (HOSPITAL_COMMUNITY): Payer: BLUE CROSS/BLUE SHIELD | Admitting: Anesthesiology

## 2016-10-26 ENCOUNTER — Encounter (HOSPITAL_COMMUNITY): Payer: Self-pay | Admitting: Certified Registered Nurse Anesthetist

## 2016-10-26 ENCOUNTER — Encounter (HOSPITAL_COMMUNITY)
Admission: RE | Disposition: A | Discharge status: Home / Self Care | Payer: Self-pay | Source: Ambulatory Visit | Attending: Surgery

## 2016-10-26 ENCOUNTER — Ambulatory Visit (HOSPITAL_COMMUNITY)
Admission: RE | Admit: 2016-10-26 | Discharge: 2016-10-26 | Disposition: A | Discharge status: Home / Self Care | Payer: BLUE CROSS/BLUE SHIELD | Source: Ambulatory Visit | Attending: Surgery | Admitting: Surgery

## 2016-10-26 DIAGNOSIS — Z6841 Body Mass Index (BMI) 40.0 and over, adult: Secondary | ICD-10-CM | POA: Insufficient documentation

## 2016-10-26 DIAGNOSIS — D171 Benign lipomatous neoplasm of skin and subcutaneous tissue of trunk: Secondary | ICD-10-CM | POA: Insufficient documentation

## 2016-10-26 DIAGNOSIS — K219 Gastro-esophageal reflux disease without esophagitis: Secondary | ICD-10-CM | POA: Insufficient documentation

## 2016-10-26 DIAGNOSIS — I10 Essential (primary) hypertension: Secondary | ICD-10-CM | POA: Diagnosis not present

## 2016-10-26 DIAGNOSIS — Z79899 Other long term (current) drug therapy: Secondary | ICD-10-CM | POA: Insufficient documentation

## 2016-10-26 DIAGNOSIS — E7800 Pure hypercholesterolemia, unspecified: Secondary | ICD-10-CM | POA: Insufficient documentation

## 2016-10-26 DIAGNOSIS — F419 Anxiety disorder, unspecified: Secondary | ICD-10-CM | POA: Diagnosis not present

## 2016-10-26 DIAGNOSIS — F329 Major depressive disorder, single episode, unspecified: Secondary | ICD-10-CM | POA: Insufficient documentation

## 2016-10-26 HISTORY — PX: LIPOMA EXCISION: SHX5283

## 2016-10-26 LAB — GLUCOSE, CAPILLARY
GLUCOSE-CAPILLARY: 116 mg/dL — AB (ref 65–99)
GLUCOSE-CAPILLARY: 108 mg/dL — AB (ref 65–99)

## 2016-10-26 SURGERY — EXCISION LIPOMA
Anesthesia: General | Site: Back

## 2016-10-26 MED ORDER — BUPIVACAINE-EPINEPHRINE 0.25% -1:200000 IJ SOLN
INTRAMUSCULAR | Status: DC | PRN
  Administered 2016-10-26: 10 mL

## 2016-10-26 MED ORDER — MIDAZOLAM HCL 5 MG/5ML IJ SOLN
INTRAMUSCULAR | Status: DC | PRN
  Administered 2016-10-26: 2 mg via INTRAVENOUS

## 2016-10-26 MED ORDER — ONDANSETRON HCL 4 MG/2ML IJ SOLN
INTRAMUSCULAR | Status: DC | PRN
Start: 2016-10-26 — End: 2016-10-26
  Administered 2016-10-26: 4 mg via INTRAVENOUS

## 2016-10-26 MED ORDER — CHLORHEXIDINE GLUCONATE CLOTH 2 % EX PADS: 6.0000 | MEDICATED_PAD | Freq: Once | CUTANEOUS | Status: DC

## 2016-10-26 MED ORDER — DEXMEDETOMIDINE HCL 200 MCG/2ML IV SOLN
INTRAVENOUS | Status: DC | PRN
  Administered 2016-10-26: 8 ug via INTRAVENOUS
  Administered 2016-10-26 (×2): 4 ug via INTRAVENOUS

## 2016-10-26 MED ORDER — ROCURONIUM BROMIDE 100 MG/10ML IV SOLN
INTRAVENOUS | Status: DC | PRN
  Administered 2016-10-26: 50 mg via INTRAVENOUS

## 2016-10-26 MED ORDER — 0.9 % SODIUM CHLORIDE (POUR BTL) OPTIME
TOPICAL | Status: DC | PRN
  Administered 2016-10-26: 1000 mL

## 2016-10-26 MED ORDER — BUPIVACAINE-EPINEPHRINE (PF) 0.25% -1:200000 IJ SOLN
INTRAMUSCULAR | Status: AC
  Filled 2016-10-26: qty 30

## 2016-10-26 MED ORDER — SUCCINYLCHOLINE CHLORIDE 200 MG/10ML IV SOSY
PREFILLED_SYRINGE | INTRAVENOUS | Status: DC | PRN
  Administered 2016-10-26: 160 mg via INTRAVENOUS

## 2016-10-26 MED ORDER — SUGAMMADEX SODIUM 500 MG/5ML IV SOLN
INTRAVENOUS | Status: DC | PRN
  Administered 2016-10-26: 500 mg via INTRAVENOUS

## 2016-10-26 MED ORDER — PROPOFOL 10 MG/ML IV BOLUS
INTRAVENOUS | Status: DC | PRN
  Administered 2016-10-26: 200 mg via INTRAVENOUS

## 2016-10-26 MED ORDER — SUGAMMADEX SODIUM 500 MG/5ML IV SOLN
INTRAVENOUS | Status: AC
  Filled 2016-10-26: qty 5

## 2016-10-26 MED ORDER — MIDAZOLAM HCL 2 MG/2ML IJ SOLN
INTRAMUSCULAR | Status: AC
  Filled 2016-10-26: qty 2

## 2016-10-26 MED ORDER — LACTATED RINGERS IV SOLN
INTRAVENOUS | Status: DC
  Administered 2016-10-26: 08:00:00 via INTRAVENOUS

## 2016-10-26 MED ORDER — DEXAMETHASONE SODIUM PHOSPHATE 10 MG/ML IJ SOLN
INTRAMUSCULAR | Status: DC | PRN
  Administered 2016-10-26: 10 mg via INTRAVENOUS

## 2016-10-26 MED ORDER — ONDANSETRON HCL 4 MG/2ML IJ SOLN
INTRAMUSCULAR | Status: AC
  Filled 2016-10-26: qty 2

## 2016-10-26 MED ORDER — FENTANYL CITRATE (PF) 100 MCG/2ML IJ SOLN
INTRAMUSCULAR | Status: DC | PRN
  Administered 2016-10-26 (×2): 25 ug via INTRAVENOUS

## 2016-10-26 MED ORDER — LIDOCAINE HCL (CARDIAC) 20 MG/ML IV SOLN
INTRAVENOUS | Status: DC | PRN
  Administered 2016-10-26: 100 mg via INTRAVENOUS

## 2016-10-26 MED ORDER — PROPOFOL 10 MG/ML IV BOLUS
INTRAVENOUS | Status: AC
  Filled 2016-10-26: qty 20

## 2016-10-26 MED ORDER — HYDROCODONE-ACETAMINOPHEN 5-325 MG PO TABS: 1.0000 | ORAL_TABLET | Freq: Four times a day (QID) | ORAL | 0 refills | Status: DC | PRN

## 2016-10-26 MED ORDER — FENTANYL CITRATE (PF) 100 MCG/2ML IJ SOLN
INTRAMUSCULAR | Status: AC
  Filled 2016-10-26: qty 2

## 2016-10-26 MED ORDER — DIPHENHYDRAMINE HCL 50 MG/ML IJ SOLN
INTRAMUSCULAR | Status: DC | PRN
  Administered 2016-10-26: 25 mg via INTRAVENOUS

## 2016-10-26 SURGICAL SUPPLY — 54 items
APL SKNCLS STERI-STRIP NONHPOA (GAUZE/BANDAGES/DRESSINGS) ×1
BENZOIN TINCTURE PRP APPL 2/3 (GAUZE/BANDAGES/DRESSINGS) ×2 IMPLANT
BLADE SURG 10 STRL SS (BLADE) ×2 IMPLANT
BLADE SURG 15 STRL LF DISP TIS (BLADE) ×1 IMPLANT
BLADE SURG 15 STRL SS (BLADE) ×2
BLADE SURG ROTATE 9660 (MISCELLANEOUS) IMPLANT
CHLORAPREP W/TINT 26ML (MISCELLANEOUS) ×3 IMPLANT
CLSR STERI-STRIP ANTIMIC 1/2X4 (GAUZE/BANDAGES/DRESSINGS) ×1 IMPLANT
COVER SURGICAL LIGHT HANDLE (MISCELLANEOUS) ×2 IMPLANT
DRAPE LAPAROTOMY 100X72 PEDS (DRAPES) ×2 IMPLANT
DRAPE UTILITY XL STRL (DRAPES) ×2 IMPLANT
DRSG TEGADERM 4X4.75 (GAUZE/BANDAGES/DRESSINGS) ×1 IMPLANT
ELECT CAUTERY BLADE 6.4 (BLADE) ×2 IMPLANT
ELECT REM PT RETURN 9FT ADLT (ELECTROSURGICAL) ×2
ELECTRODE REM PT RTRN 9FT ADLT (ELECTROSURGICAL) ×1 IMPLANT
GAUZE SPONGE 2X2 8PLY STRL LF (GAUZE/BANDAGES/DRESSINGS) IMPLANT
GAUZE SPONGE 4X4 12PLY STRL (GAUZE/BANDAGES/DRESSINGS) ×1 IMPLANT
GLOVE BIO SURGEON STRL SZ 6.5 (GLOVE) ×1 IMPLANT
GLOVE BIO SURGEON STRL SZ7 (GLOVE) ×2 IMPLANT
GLOVE BIO SURGEON STRL SZ8 (GLOVE) ×1 IMPLANT
GLOVE BIOGEL PI IND STRL 6.5 (GLOVE) IMPLANT
GLOVE BIOGEL PI IND STRL 7.5 (GLOVE) ×1 IMPLANT
GLOVE BIOGEL PI IND STRL 8.5 (GLOVE) IMPLANT
GLOVE BIOGEL PI INDICATOR 6.5 (GLOVE) ×1
GLOVE BIOGEL PI INDICATOR 7.5 (GLOVE) ×1
GLOVE BIOGEL PI INDICATOR 8.5 (GLOVE) ×2
GLOVE SS BIOGEL STRL SZ 8 (GLOVE) IMPLANT
GLOVE SUPERSENSE BIOGEL SZ 8 (GLOVE) ×1
GOWN STRL REUS W/ TWL LRG LVL3 (GOWN DISPOSABLE) ×2 IMPLANT
GOWN STRL REUS W/ TWL XL LVL3 (GOWN DISPOSABLE) IMPLANT
GOWN STRL REUS W/TWL LRG LVL3 (GOWN DISPOSABLE) ×4
GOWN STRL REUS W/TWL XL LVL3 (GOWN DISPOSABLE) ×4
KIT BASIN OR (CUSTOM PROCEDURE TRAY) ×2 IMPLANT
KIT ROOM TURNOVER OR (KITS) ×2 IMPLANT
NDL HYPO 25GX1X1/2 BEV (NEEDLE) ×1 IMPLANT
NEEDLE HYPO 25GX1X1/2 BEV (NEEDLE) ×2 IMPLANT
NS IRRIG 1000ML POUR BTL (IV SOLUTION) ×2 IMPLANT
PACK SURGICAL SETUP 50X90 (CUSTOM PROCEDURE TRAY) ×2 IMPLANT
PAD ARMBOARD 7.5X6 YLW CONV (MISCELLANEOUS) ×2 IMPLANT
PENCIL BUTTON HOLSTER BLD 10FT (ELECTRODE) ×2 IMPLANT
SPECIMEN JAR MEDIUM (MISCELLANEOUS) ×1 IMPLANT
SPECIMEN JAR SMALL (MISCELLANEOUS) ×2 IMPLANT
SPONGE GAUZE 2X2 STER 10/PKG (GAUZE/BANDAGES/DRESSINGS) ×1
SPONGE LAP 18X18 X RAY DECT (DISPOSABLE) ×2 IMPLANT
STRIP CLOSURE SKIN 1/2X4 (GAUZE/BANDAGES/DRESSINGS) ×2 IMPLANT
SUT MNCRL AB 4-0 PS2 18 (SUTURE) ×2 IMPLANT
SUT VIC AB 2-0 SH 27 (SUTURE)
SUT VIC AB 2-0 SH 27X BRD (SUTURE) IMPLANT
SUT VIC AB 3-0 SH 27 (SUTURE) ×2
SUT VIC AB 3-0 SH 27XBRD (SUTURE) ×1 IMPLANT
SYR BULB 3OZ (MISCELLANEOUS) ×2 IMPLANT
SYR CONTROL 10ML LL (SYRINGE) ×2 IMPLANT
TOWEL OR 17X24 6PK STRL BLUE (TOWEL DISPOSABLE) ×2 IMPLANT
TOWEL OR 17X26 10 PK STRL BLUE (TOWEL DISPOSABLE) ×1 IMPLANT

## 2016-10-26 NOTE — Op Note (Signed)
Preop diagnosis: Subcutaneous lipoma upper back (4 cm) Postop diagnosis: Same Procedure performed: Excision of subcutaneous lipoma upper back Surgeon:Keimari Altamont K. Anesthesia: Gen. endotracheal Indications: This is a 51 year old female who presents with an enlarging mass on her upper back near the base of her neck. This has become fairly large and is uncomfortable. It causes occasional headaches. She would like to have this area removed.  Description of procedure: The patient brought to the operating room and placed in a supine position on the stretcher. After an adequate level of general anesthesia was obtained, the patient was moved to a prone position on chest rolls on the operating table. Her upper back was prepped with ChloraPrep and draped in sterile fashion. A timeout was taken to ensure the proper patient and proper procedure. We infiltrated the area over the mass with 0.25% Marcaine with epinephrine. A transverse incision was made. We dissected down through the dermis to the surface lipoma. This is a multilobulated lipoma with multiple extensions into the surrounding subcutaneous tissue. We removed the entire mass down to the underlying muscle. Hemostasis was obtained with cautery. The wound was closed with a deep layer of 3-0 Vicryl and a subcuticular layer of 4-0 Monocryl. Benzoin Steri-Strips were applied. The patient was then extubated and brought to the recovery room in stable condition. All sponge, instrument, and needle counts are correct.  Imogene Burn. Georgette Dover, MD, [facility] Surgery  General/ Trauma Surgery  10/26/2016 11:20 AM

## 2016-10-26 NOTE — Anesthesia Preprocedure Evaluation (Addendum)
Anesthesia Evaluation  Patient identified by MRN, date of birth, ID band Patient awake    Reviewed: Allergy & Precautions, H&P , Patient's Chart, lab work & pertinent test results, reviewed documented beta blocker date and time   Airway Mallampati: III  TM Distance: >3 FB Neck ROM: full    Dental no notable dental hx.    Pulmonary    Pulmonary exam normal breath sounds clear to auscultation       Cardiovascular hypertension, On Medications  Rhythm:regular Rate:Normal     Neuro/Psych    GI/Hepatic   Endo/Other  diabetesMorbid obesity  Renal/GU      Musculoskeletal   Abdominal   Peds  Hematology   Anesthesia Other Findings   Reproductive/Obstetrics                            Anesthesia Physical Anesthesia Plan  ASA: III  Anesthesia Plan: General   Post-op Pain Management:    Induction: Intravenous  Airway Management Planned: Oral ETT  Additional Equipment:   Intra-op Plan:   Post-operative Plan: Extubation in OR  Informed Consent: I have reviewed the patients History and Physical, chart, labs and discussed the procedure including the risks, benefits and alternatives for the proposed anesthesia with the patient or authorized representative who has indicated his/her understanding and acceptance.   Dental Advisory Given and Dental advisory given  Plan Discussed with: CRNA and Surgeon  Anesthesia Plan Comments: (  Discussed general anesthesia, including possible nausea, instrumentation of airway, sore throat,pulmonary aspiration, etc. I asked if the were any outstanding questions, or  concerns before we proceeded.)        Anesthesia Quick Evaluation

## 2016-10-26 NOTE — Anesthesia Postprocedure Evaluation (Signed)
Anesthesia Post Note  Patient: Brandy King  Procedure(s) Performed: Procedure(s) (LRB): EXCISION SUBCUTANEOUS LIPOMA UPPER BACK (N/A)  Patient location during evaluation: PACU Anesthesia Type: General Level of consciousness: sedated Pain management: satisfactory to patient Vital Signs Assessment: post-procedure vital signs reviewed and stable Respiratory status: spontaneous breathing Cardiovascular status: stable Anesthetic complications: no       Last Vitals:  Vitals:   10/26/16 1200 10/26/16 1215  BP: (!) 100/50 104/61  Pulse: (!) 58 (!) 50  Resp: 18 16  Temp:      Last Pain:  Vitals:   10/26/16 1200  TempSrc:   PainSc: 0-No pain                 Dontrey Snellgrove EDWARD

## 2016-10-26 NOTE — H&P (Signed)
History of Present Illness  The patient is a 51 year old female who presents with a complaint of Mass. This is a 51 year old female referred by Dr. Raoul Pitch for evaluation of an enlarging mass on her upper back near the base of her neck. This has been present for about a year and a half and has slowly enlarged. It is causing some significant discomfort with pain radiating up into the base of her neck and occasionally causing headaches. This area has never been infected. No imaging studies. She presents now to discuss excision.   Other Problems  Anxiety Disorder Back Pain Cholelithiasis Depression Gastroesophageal Reflux Disease High blood pressure Hypercholesterolemia Migraine Headache  Past Surgical History  Cesarean Section - 1 Gallbladder Surgery - Laparoscopic Tonsillectomy  Diagnostic Studies History Colonoscopy never Mammogram 1-3 years ago Pap Smear 1-5 years ago  Allergies  Aspir-81 *ANALGESICS - NonNarcotic* Ibuprofen *ANALGESICS - ANTI-INFLAMMATORY* PROzac *ANTIDEPRESSANTS*  Medication History Bisoprolol-Hydrochlorothiazide (10-6.25MG  Tablet, Oral every other day) Active. MetFORMIN HCl (500MG  Tablet, Oral) Active. PriLOSEC (20MG  Capsule DR, Oral daily) Active. Estrogens, Conjugated (0.625MG /GM Cream, Vaginal as needed) Active. Medications Reconciled  Social History  Caffeine use Carbonated beverages, Tea. No alcohol use No drug use Tobacco use Never smoker.  Family History  Arthritis Father. Cancer Father. Colon Polyps Father. Depression Mother. Diabetes Mellitus Mother. Heart Disease Father. Hypertension Brother, Father, Mother.  Pregnancy / Birth History  Age at menarche 61 years. Gravida 5 Irregular periods Length (months) of breastfeeding 3-6 Maternal age 71-20 Para 4     Review of Systems  General Not Present- Appetite Loss, Chills, Fatigue, Fever, Night Sweats, Weight Gain and Weight  Loss. Skin Not Present- Change in Wart/Mole, Dryness, Hives, Jaundice, New Lesions, Non-Healing Wounds, Rash and Ulcer. HEENT Not Present- Earache, Hearing Loss, Hoarseness, Nose Bleed, Oral Ulcers, Ringing in the Ears, Seasonal Allergies, Sinus Pain, Sore Throat, Visual Disturbances, Wears glasses/contact lenses and Yellow Eyes. Respiratory Not Present- Bloody sputum, Chronic Cough, Difficulty Breathing, Snoring and Wheezing. Breast Not Present- Breast Mass, Breast Pain, Nipple Discharge and Skin Changes. Cardiovascular Present- Swelling of Extremities. Not Present- Chest Pain, Difficulty Breathing Lying Down, Leg Cramps, Palpitations, Rapid Heart Rate and Shortness of Breath. Gastrointestinal Not Present- Abdominal Pain, Bloating, Bloody Stool, Change in Bowel Habits, Chronic diarrhea, Constipation, Difficulty Swallowing, Excessive gas, Gets full quickly at meals, Hemorrhoids, Indigestion, Nausea, Rectal Pain and Vomiting. Female Genitourinary Not Present- Frequency, Nocturia, Painful Urination, Pelvic Pain and Urgency. Musculoskeletal Not Present- Back Pain, Joint Pain, Joint Stiffness, Muscle Pain, Muscle Weakness and Swelling of Extremities. Neurological Not Present- Decreased Memory, Fainting, Headaches, Numbness, Seizures, Tingling, Tremor, Trouble walking and Weakness. Psychiatric Present- Anxiety. Not Present- Bipolar, Change in Sleep Pattern, Depression, Fearful and Frequent crying. Endocrine Present- Hot flashes. Not Present- Cold Intolerance, Excessive Hunger, Hair Changes, Heat Intolerance and New Diabetes. Hematology Not Present- Blood Thinners, Easy Bruising, Excessive bleeding, Gland problems, HIV and Persistent Infections.  Vitals  Weight: 326.8 lb Height: 67in Body Surface Area: 2.49 m Body Mass Index: 51.18 kg/m  Temp.: 98.50F(Oral)  Pulse: 78 (Regular)  BP: 120/76 (Sitting, Left Arm, Standard)      Physical Exam   The physical exam findings are as  follows: Note:Obese female in NAD Eyes: Pupils equal, round; sclera anicteric HENT: Oral mucosa moist; good dentition Neck: No masses palpated, no thyromegaly Back: below base of neck - 4 cm protruding subcutaneous mass - smooth, well-demarcated; no sign of inflammation Lungs: CTA bilaterally; normal respiratory effort CV: Regular rate and rhythm; no murmurs; extremities well-perfused  with no edema Abd: +bowel sounds, soft, non-tender, no palpable organomegaly; no palpable hernias Skin: Warm, dry; no sign of jaundice Psychiatric - alert and oriented x 4; calm mood and affect    Assessment & Plan   LIPOMA OF BACK (D17.1)  Current Plans Schedule for Surgery - Excision of subcutaneous mass - upper back. The surgical procedure has been discussed with the patient. Potential risks, benefits, alternative treatments, and expected outcomes have been explained. All of the patient's questions at this time have been answered. The likelihood of reaching the patient's treatment goal is good. The patient understand the proposed surgical procedure and wishes to proceed.  Imogene Burn. Georgette Dover, MD, [facility]  General/ Trauma Surgery  10/26/2016 8:23 AM

## 2016-10-26 NOTE — Discharge Instructions (Signed)
[facility] Office Phone Number [phone]  POST OP INSTRUCTIONS  Always review your discharge instruction sheet given to you by the facility where your surgery was performed.  IF YOU HAVE DISABILITY OR FAMILY LEAVE FORMS, YOU MUST BRING THEM TO THE OFFICE FOR PROCESSING.  DO NOT GIVE THEM TO YOUR DOCTOR.  1. A prescription for pain medication may be given to you upon discharge.  Take your pain medication as prescribed, if needed.  If narcotic pain medicine is not needed, then you may take acetaminophen (Tylenol) or ibuprofen (Advil) as needed. 2. Take your usually prescribed medications unless otherwise directed 3. If you need a refill on your pain medication, please contact your pharmacy.  They will contact our office to request authorization.  Prescriptions will not be filled after 5pm or on week-ends. 4. You should eat very light the first 24 hours after surgery, such as soup, crackers, pudding, etc.  Resume your normal diet the day after surgery. 5. Most patients will experience some swelling and bruising around the incision.  Swelling and bruising can take several days to resolve.  6. It is common to experience some constipation if taking pain medication after surgery.  Increasing fluid intake and taking a stool softener will usually help or prevent this problem from occurring.  A mild laxative (Milk of Magnesia or Miralax) should be taken according to package directions if there are no bowel movements after 48 hours. 7. Unless discharge instructions indicate otherwise, you may remove your bandages 48 hours after surgery, and you may shower at that time.  You will have steri-strips (small skin tapes) in place directly over the incision.  These strips should be left on the skin for 7-10 days.   Any sutures or staples will be removed at the office during your follow-up visit. 8. ACTIVITIES:  You may resume regular daily activities (gradually increasing) beginning the next day.   You may have sexual intercourse when it is comfortable. a. You may drive when you no longer are taking prescription pain medication, you can comfortably wear a seatbelt, and you can safely maneuver your car and apply brakes. b. RETURN TO WORK:  1-2 weeks 9. You should see your doctor in the office for a follow-up appointment approximately two to three weeks after your surgery.  You may call to check if you do not hear from [place] after three days. 10. OTHER INSTRUCTIONS: _______________________________________________________________________________________________ _____________________________________________________________________________________________________________________________________ _____________________________________________________________________________________________________________________________________ _____________________________________________________________________________________________________________________________________  WHEN TO CALL YOUR DOCTOR: 1. Fever over 101.0 2. Nausea and/or vomiting. 3. Extreme swelling or bruising. 4. Continued bleeding from incision. 5. Increased pain, redness, or drainage from the incision.  The clinic staff is available to answer your questions during regular business hours.  Please dont hesitate to call and ask to speak to one of the nurses for clinical concerns.  If you have a medical emergency, go to the nearest emergency room or call 911.  A surgeon from [facility] is always on call at the hospital.  For further questions, please visit [email or web address]

## 2016-10-26 NOTE — Anesthesia Procedure Notes (Signed)
Procedure Name: Intubation Date/Time: 10/26/2016 10:27 AM Performed by: Valda Favia Pre-anesthesia Checklist: Patient identified, Emergency Drugs available, Suction available, Patient being monitored and Timeout performed Patient Re-evaluated:Patient Re-evaluated prior to inductionOxygen Delivery Method: Circle system utilized Preoxygenation: Pre-oxygenation with 100% oxygen Intubation Type: IV induction Ventilation: Mask ventilation without difficulty Laryngoscope Size: Glidescope and 4 Grade View: Grade I Tube type: Oral Tube size: 7.0 mm Number of attempts: 1 Airway Equipment and Method: Stylet Placement Confirmation: ETT inserted through vocal cords under direct vision,  positive ETCO2 and breath sounds checked- equal and bilateral Secured at: 21 cm Tube secured with: Tape Dental Injury: Teeth and Oropharynx as per pre-operative assessment

## 2016-10-26 NOTE — Transfer of Care (Signed)
Immediate Anesthesia Transfer of Care Note  Patient: Brandy King  Procedure(s) Performed: Procedure(s): EXCISION SUBCUTANEOUS LIPOMA UPPER BACK (N/A)  Patient Location: PACU  Anesthesia Type:General  Level of Consciousness: sedated  Airway & Oxygen Therapy: Patient Spontanous Breathing and Patient connected to nasal cannula oxygen  Post-op Assessment: Report given to RN and Post -op Vital signs reviewed and stable  Post vital signs: Reviewed and stable  Last Vitals:  Vitals:   10/26/16 0812 10/26/16 1130  BP: (!) 135/53 140/70  Pulse: 61 (!) 57  Resp: 20 (!) 22  Temp: 36.4 C     Last Pain:  Vitals:   10/26/16 0812  TempSrc: Oral         Complications: No apparent anesthesia complications

## 2016-10-27 ENCOUNTER — Encounter (HOSPITAL_COMMUNITY): Payer: Self-pay | Admitting: Surgery

## 2016-11-28 ENCOUNTER — Other Ambulatory Visit: Payer: Self-pay

## 2016-11-28 NOTE — Telephone Encounter (Signed)
Entered in error

## 2017-03-27 ENCOUNTER — Other Ambulatory Visit: Payer: Self-pay | Admitting: Family Medicine

## 2017-03-27 DIAGNOSIS — Z1231 Encounter for screening mammogram for malignant neoplasm of breast: Secondary | ICD-10-CM

## 2017-04-12 ENCOUNTER — Ambulatory Visit
Admission: RE | Admit: 2017-04-12 | Discharge: 2017-04-12 | Disposition: A | Discharge status: Home / Self Care | Payer: BLUE CROSS/BLUE SHIELD | Source: Ambulatory Visit | Attending: Family Medicine | Admitting: Family Medicine

## 2017-04-12 DIAGNOSIS — Z1231 Encounter for screening mammogram for malignant neoplasm of breast: Secondary | ICD-10-CM

## 2017-06-16 ENCOUNTER — Ambulatory Visit (INDEPENDENT_AMBULATORY_CARE_PROVIDER_SITE_OTHER): Payer: BLUE CROSS/BLUE SHIELD | Admitting: Podiatry

## 2017-06-16 ENCOUNTER — Encounter: Payer: Self-pay | Admitting: Podiatry

## 2017-06-16 ENCOUNTER — Ambulatory Visit (INDEPENDENT_AMBULATORY_CARE_PROVIDER_SITE_OTHER): Payer: BLUE CROSS/BLUE SHIELD

## 2017-06-16 VITALS — BP 127/76 | HR 53 | Resp 16

## 2017-06-16 DIAGNOSIS — M7661 Achilles tendinitis, right leg: Secondary | ICD-10-CM

## 2017-06-16 DIAGNOSIS — M722 Plantar fascial fibromatosis: Secondary | ICD-10-CM | POA: Diagnosis not present

## 2017-06-16 MED ORDER — TRIAMCINOLONE ACETONIDE 10 MG/ML IJ SUSP
10.0000 mg | Freq: Once | INTRAMUSCULAR | Status: AC
  Administered 2017-06-16: 10 mg

## 2017-06-16 NOTE — Patient Instructions (Signed)

## 2017-06-16 NOTE — Progress Notes (Signed)
   Subjective:    Patient ID: Brandy King, female    DOB: 21-Jun-1966, 51 y.o.   MRN: [id number]  HPI Chief Complaint  Patient presents with  . Foot Pain    Right foot; back of heel; x2 yrs      Review of Systems  Cardiovascular: Positive for palpitations.  Musculoskeletal: Positive for back pain and myalgias.  All other systems reviewed and are negative.      Objective:   Physical Exam        Assessment & Plan:

## 2017-06-16 NOTE — Progress Notes (Signed)
Subjective:    Patient ID: Brandy King, female   DOB: 51 y.o.   MRN: [id number]   HPI patient presents with two-year history of pain in the back of the right heel and states it's worsened over the last 4 months. States that she is at the point now where she cannot wear any form shoe gear with any degree of comfort and she's not able to exercise and she does have obesity that she would like to address    Review of Systems  All other systems reviewed and are negative.       Objective:  Physical Exam  Constitutional: She appears well-developed and well-nourished.  Cardiovascular: Intact distal pulses.   Musculoskeletal: Normal range of motion.  Neurological: She is alert.  Skin: Skin is warm.  Nursing note and vitals reviewed.  neurovascular status found to be intact muscle strength adequate no equinus condition noted currently. Patient's found to have exquisite posterior pain right heel at the insertional point of the Achilles to the calcaneus mostly on the lateral side. There is enlargement of the area and indicating probable bone spur formation and it is inflamed when palpated. Patient's found to have good digital perfusion and well oriented 3     Assessment:    Achilles tendinitis right with inflammation fluid was spur formation     Plan:    H&P condition reviewed and education rendered. At this time I did discuss injection explaining risk of rupture associated with this along with immobilization and that my hope this we can avoid surgery. She wants to go this route understanding she may ultimately need surgery and today I carefully injected the lateral side of the insertional Achilles 3 mg dexamethasone Kenalog 5 mg Xylocaine and after the medicine was applied we did place her in an air fracture walker to completely immobilize. I gave instructions on wearing this full-time for at least 2 weeks and to utilize ice therapy and will be seen back in 3 weeks to reevaluate and may require  other treatments depending on response  X-rays indicate large posterior spur formation right

## 2017-07-07 ENCOUNTER — Encounter: Payer: Self-pay | Admitting: Podiatry

## 2017-07-07 ENCOUNTER — Ambulatory Visit (INDEPENDENT_AMBULATORY_CARE_PROVIDER_SITE_OTHER): Payer: BLUE CROSS/BLUE SHIELD | Admitting: Podiatry

## 2017-07-07 DIAGNOSIS — M7661 Achilles tendinitis, right leg: Secondary | ICD-10-CM | POA: Diagnosis not present

## 2017-07-07 NOTE — Progress Notes (Signed)
Subjective:    Patient ID: Brandy King, female   DOB: 51 y.o.   MRN: [id number]   HPI patient states doing much better with her heel with significant reduction of discomfort    ROS      Objective:  Physical Exam neurovascular status intact muscle strength adequate with patient's posterior heel showing reduced inflammation fluid buildup with good range of motion and minimal discomfort when palpated     Assessment:    Significant reduction of Achilles tendinitis right     Plan:   Reviewed condition and recommended heat ice therapy continued immobilization as needed and I did dispense a silicone ankle compression stocking. Reappoint as needed

## 2017-09-25 ENCOUNTER — Telehealth: Payer: Self-pay | Admitting: Podiatry

## 2017-09-25 NOTE — Telephone Encounter (Signed)
I was calling to see when I can get another injection? It has been hurting me a lot lately. If you would please call me back at [phone]. Thank you and have a great day.

## 2018-10-29 ENCOUNTER — Encounter: Payer: Self-pay | Admitting: Family Medicine

## 2018-10-29 ENCOUNTER — Telehealth: Payer: BLUE CROSS/BLUE SHIELD | Admitting: Family Medicine

## 2018-10-29 DIAGNOSIS — R059 Cough, unspecified: Secondary | ICD-10-CM

## 2018-10-29 DIAGNOSIS — R05 Cough: Secondary | ICD-10-CM

## 2018-10-29 MED ORDER — BENZONATATE 100 MG PO CAPS: 100.0000 mg | ORAL_CAPSULE | Freq: Three times a day (TID) | ORAL | 0 refills | Status: AC | PRN

## 2018-10-29 NOTE — Progress Notes (Signed)

## 2019-01-07 ENCOUNTER — Other Ambulatory Visit: Payer: Self-pay | Admitting: Family Medicine

## 2019-01-07 DIAGNOSIS — Z1231 Encounter for screening mammogram for malignant neoplasm of breast: Secondary | ICD-10-CM

## 2019-08-08 ENCOUNTER — Other Ambulatory Visit: Payer: Self-pay

## 2019-08-08 ENCOUNTER — Emergency Department (HOSPITAL_COMMUNITY)
Admission: EM | Admit: 2019-08-08 | Discharge: 2019-08-08 | Disposition: A | Discharge status: Home / Self Care | Payer: BLUE CROSS/BLUE SHIELD | Attending: Emergency Medicine | Admitting: Emergency Medicine

## 2019-08-08 ENCOUNTER — Emergency Department (HOSPITAL_COMMUNITY): Payer: BLUE CROSS/BLUE SHIELD

## 2019-08-08 DIAGNOSIS — I10 Essential (primary) hypertension: Secondary | ICD-10-CM | POA: Diagnosis not present

## 2019-08-08 DIAGNOSIS — R197 Diarrhea, unspecified: Secondary | ICD-10-CM | POA: Insufficient documentation

## 2019-08-08 DIAGNOSIS — E119 Type 2 diabetes mellitus without complications: Secondary | ICD-10-CM | POA: Diagnosis not present

## 2019-08-08 DIAGNOSIS — U071 COVID-19: Secondary | ICD-10-CM | POA: Diagnosis present

## 2019-08-08 DIAGNOSIS — R112 Nausea with vomiting, unspecified: Secondary | ICD-10-CM | POA: Diagnosis not present

## 2019-08-08 DIAGNOSIS — Z79899 Other long term (current) drug therapy: Secondary | ICD-10-CM | POA: Insufficient documentation

## 2019-08-08 LAB — CBC WITH DIFFERENTIAL/PLATELET
Abs Immature Granulocytes: 0.02 10*3/uL (ref 0.00–0.07)
Basophils Absolute: 0 10*3/uL (ref 0.0–0.1)
Basophils Relative: 0 %
Eosinophils Absolute: 0 10*3/uL (ref 0.0–0.5)
Eosinophils Relative: 0 %
HCT: 36 % (ref 36.0–46.0)
Hemoglobin: 11.7 g/dL — ABNORMAL LOW (ref 12.0–15.0)
Immature Granulocytes: 0 %
Lymphocytes Relative: 21 %
Lymphs Abs: 1 10*3/uL (ref 0.7–4.0)
MCH: 28.5 pg (ref 26.0–34.0)
MCHC: 32.5 g/dL (ref 30.0–36.0)
MCV: 87.8 fL (ref 80.0–100.0)
Monocytes Absolute: 0.3 10*3/uL (ref 0.1–1.0)
Monocytes Relative: 6 %
Neutro Abs: 3.5 10*3/uL (ref 1.7–7.7)
Neutrophils Relative %: 73 %
Platelets: 131 10*3/uL — ABNORMAL LOW (ref 150–400)
RBC: 4.1 MIL/uL (ref 3.87–5.11)
RDW: 14.1 % (ref 11.5–15.5)
WBC: 4.8 10*3/uL (ref 4.0–10.5)
nRBC: 0 % (ref 0.0–0.2)

## 2019-08-08 LAB — COMPREHENSIVE METABOLIC PANEL
ALT: 50 U/L — ABNORMAL HIGH (ref 0–44)
AST: 40 U/L (ref 15–41)
Albumin: 3.5 g/dL (ref 3.5–5.0)
Alkaline Phosphatase: 61 U/L (ref 38–126)
Anion gap: 9 (ref 5–15)
BUN: 8 mg/dL (ref 6–20)
CO2: 24 mmol/L (ref 22–32)
Calcium: 8.4 mg/dL — ABNORMAL LOW (ref 8.9–10.3)
Chloride: 106 mmol/L (ref 98–111)
Creatinine, Ser: 0.6 mg/dL (ref 0.44–1.00)
GFR calc Af Amer: >60 mL/min (ref >60)
GFR calc non Af Amer: >60 mL/min (ref >60)
Glucose, Bld: 141 mg/dL — ABNORMAL HIGH (ref 70–99)
Potassium: 3 mmol/L — ABNORMAL LOW (ref 3.5–5.1)
Sodium: 139 mmol/L (ref 135–145)
Total Bilirubin: 0.3 mg/dL (ref 0.3–1.2)
Total Protein: 6.9 g/dL (ref 6.5–8.1)

## 2019-08-08 LAB — LIPASE, BLOOD: Lipase: 40 U/L (ref 11–51)

## 2019-08-08 MED ORDER — SODIUM CHLORIDE 0.9 % IV BOLUS
1000.0000 mL | Freq: Once | INTRAVENOUS | Status: AC
  Administered 2019-08-08: 1000 mL via INTRAVENOUS

## 2019-08-08 MED ORDER — ONDANSETRON HCL 4 MG/2ML IJ SOLN
4.0000 mg | Freq: Once | INTRAMUSCULAR | Status: AC
  Administered 2019-08-08: 4 mg via INTRAVENOUS
  Filled 2019-08-08: qty 2

## 2019-08-08 MED ORDER — POTASSIUM CHLORIDE CRYS ER 20 MEQ PO TBCR
40.0000 meq | EXTENDED_RELEASE_TABLET | Freq: Once | ORAL | Status: AC
  Administered 2019-08-08: 40 meq via ORAL
  Filled 2019-08-08: qty 2

## 2019-08-08 MED ORDER — ONDANSETRON 4 MG PO TBDP: 4.0000 mg | ORAL_TABLET | Freq: Three times a day (TID) | ORAL | 0 refills | Status: AC | PRN

## 2019-08-08 MED ORDER — POTASSIUM CHLORIDE 10 MEQ/100ML IV SOLN
10.0000 meq | Freq: Once | INTRAVENOUS | Status: AC
  Administered 2019-08-08: 10 meq via INTRAVENOUS
  Filled 2019-08-08: qty 100

## 2019-08-08 NOTE — Discharge Instructions (Addendum)
Take the Zofran as needed make sure to stay adequately hydrated and take small sips of liquid.  Return if you develop any new or worsening symptoms.  You may take Tylenol as needed for pain.  Do not take more than 4000 g daily.

## 2019-08-08 NOTE — ED Provider Notes (Signed)
[facility] DEPT Provider Note   CSN: [id number] Arrival date & time: 08/08/19  1836   History   Chief Complaint Chief Complaint  Patient presents with   COVID +   HPI Brandy King is a 53 y.o. female with past medical history significant for anxiety, migraine, hypertension, prediabetes, known COVID positive who presents for evaluation of nausea vomiting diarrhea as well as generalized fatigue.  She was seen at [facility] earlier today.  Had 2 L of fluids.  She was not prescribed antiemetic.  Patient states she was given Zofran which did help her symptoms.  States she was able to sleep however when she awoke she tried eating solid food and had an episode of nonbloody, nonbilious emesis.  Patient states she has had cramping abdominal pain which is resolved with bowel movements.  Bowel movements not melena hematochezia.  She has had 3 episodes of diarrhea today.  States she did have shortness of breath earlier today when she was evaluated at [facility] however per their note and labs likely related to her known COVID.  States she has been intermittently running fevers at home, temp max 101.0 yesterday.  She has been intermittently taking Tylenol.  She has not taken any NSAIDs.  She was taking an old prescription of Phenergan this morning.  Patient states her fever "spiked at home and she fell all over week.  She has had intermittent headaches however denies sudden onset thunderclap headache, unilateral weakness, slurred speech, tinnitus, vision changes chest pain, shortness of breath, hemoptysis, unilateral leg swelling, redness, warmth.  No melena, hematochezia.  Denies additional aggravating or alleviating factors.  History obtained from patient and past medical records. No interpretor was used.     HPI  Past Medical History:  Diagnosis Date   Allergy    Anxiety    Arthritis    "smaller disc space in the lumbar region"   Complication of anesthesia     Depression    Dysrhythmia    extra beat seen on a previous study, told that its in the ventricle   History of chicken pox    Hypertension    Migraine    PONV (postoperative nausea and vomiting)    Pre-diabetes    metformin not tolerated , last HgbA1c- 6.1   Prediabetes     Patient Active Problem List   Diagnosis Date Noted   Encounter for preventive health examination 02/15/2016   Encounter for screening mammogram for malignant neoplasm of breast 02/15/2016   Colon cancer screening 02/15/2016   Vitamin D deficiency 02/12/2016   Diabetes mellitus ([place]) 0000000   Metabolic syndrome 0000000   Encounter to establish care 11/04/2015   Depression 11/04/2015   Essential hypertension, benign 11/04/2015   Fatigue 11/04/2015   GERD (gastroesophageal reflux disease) 11/04/2015   Perimenopausal 11/04/2015    Past Surgical History:  Procedure Laterality Date   [place]   x2 VBAC following C/Section with first delivery    CHOLECYSTECTOMY     cholestecomy  1977   ENDOMETRIAL ABLATION     Dr Ouida Sills 2005   [place] N/A 10/26/2016   Procedure: [place];  Surgeon: Donnie Mesa, MD;  Location: [place];  Service: General;  Laterality: N/A;   TONSILLECTOMY AND ADENOIDECTOMY       OB History   No obstetric history on file.      Home Medications    Prior to Admission medications  Medication Sig Start Date End Date Taking? Authorizing Provider  acetaminophen (TYLENOL) 500 MG tablet Take 1,000 mg by mouth every 6 (six) hours as needed for moderate pain or headache.   Yes [provider]  Cholecalciferol (VITAMIN D-1000 MAX ST) 25 MCG (1000 UT) tablet Take 25 mcg by mouth daily. 01/08/19  Yes [provider]  citalopram (CELEXA) 20 MG tablet Take 20 mg by mouth at bedtime.  09/29/16  Yes [provider]  clonazePAM (KLONOPIN) 1 MG tablet Take 0.5 mg by mouth 2  (two) times daily as needed for anxiety. 09/29/16  Yes [provider]  promethazine (PHENERGAN) 25 MG tablet Take 25 mg by mouth daily as needed for nausea/vomiting. 08/02/19  Yes [provider]  benzonatate (TESSALON) 100 MG capsule Take 1-2 capsules (100-200 mg total) by mouth 3 (three) times daily as needed for cough. Patient not taking: Reported on 08/08/2019 10/29/18   Shella Maxim, NP  bisoprolol-hydrochlorothiazide [facility]) 10-6.25 MG tablet Take 1 tablet by mouth daily. Patient not taking: Reported on 08/08/2019 06/21/16   Howard Pouch A, DO  ondansetron (ZOFRAN ODT) 4 MG disintegrating tablet Take 1 tablet (4 mg total) by mouth every 8 (eight) hours as needed for nausea or vomiting. 08/08/19   Charnelle Bergeman A, PA-C    Family History Family History  Problem Relation Age of Onset   Diabetes Mother    Diabetes Father    Heart disease Father    Arthritis Father    Cancer Father        Blood cancer (unknown)    Cancer Paternal Aunt        bladder   Cancer Paternal Uncle        sternal tumor/kidney cancer   Breast cancer Maternal Grandmother     Social History Social History   Tobacco Use   Smoking status: Never Smoker   Smokeless tobacco: Never Used  Substance Use Topics   Alcohol use: No   Drug use: No    Allergies   Asa [aspirin], Ibuprofen, Metformin and related, and Prozac [fluoxetine hcl]   Review of Systems Review of Systems  Constitutional: Positive for appetite change, chills and fever.  HENT: Negative.   Respiratory: Negative.   Cardiovascular: Negative.   Gastrointestinal: Positive for diarrhea, nausea and vomiting. Negative for abdominal distention, abdominal pain, anal bleeding, blood in stool, constipation and rectal pain.  Genitourinary: Negative.   Musculoskeletal: Negative.   Neurological: Positive for headaches (Intermittent HA). Negative for dizziness, tremors, seizures, syncope, facial asymmetry, speech difficulty,  weakness, light-headedness and numbness.  All other systems reviewed and are negative.   Physical Exam Updated Vital Signs BP (!) 148/84    Pulse 64    Temp 99 F (37.2 C) (Oral)    Resp 17    Ht 5\' 7"  (1.702 m)    Wt 131.5 kg    SpO2 94%    BMI 45.42 kg/m   Physical Exam Vitals signs and nursing note reviewed.  Constitutional:      General: She is not in acute distress.    Appearance: She is not ill-appearing, toxic-appearing or diaphoretic.  HENT:     Head: Normocephalic and atraumatic.     Jaw: There is normal jaw occlusion.     Right Ear: Tympanic membrane, ear canal and external ear normal. There is no impacted cerumen. No hemotympanum. Tympanic membrane is not injected, scarred, perforated, erythematous, retracted or bulging.     Left Ear: Tympanic membrane, ear canal and external  ear normal. There is no impacted cerumen. No hemotympanum. Tympanic membrane is not injected, scarred, perforated, erythematous, retracted or bulging.     Ears:     Comments: No Mastoid tenderness.    Nose:     Comments: Clear rhinorrhea and congestion to bilateral nares.  No sinus tenderness.    Mouth/Throat:     Comments: Posterior oropharynx clear.  Mucous membranes moist.  Tonsils without erythema or exudate.  Uvula midline without deviation.  No evidence of PTA or RPA.  No drooling, dysphasia or trismus.  Phonation normal. Neck:     Trachea: Trachea and phonation normal.     Meningeal: Brudzinski's sign and Kernig's sign absent.     Comments: No Neck stiffness or neck rigidity.  No meningismus.  No cervical lymphadenopathy. Cardiovascular:     Comments: No murmurs rubs or gallops. Pulmonary:     Comments: Clear to auscultation bilaterally without wheeze, rhonchi or rales.  No accessory muscle usage.  Able speak in full sentences. Abdominal:     Comments: Soft, nontender without rebound or guarding.  No CVA tenderness.  Musculoskeletal:     Comments: Moves all 4 extremities without difficulty.   Lower extremities without edema, erythema or warmth.  Skin:    Comments: Brisk capillary refill.  No rashes or lesions.  Neurological:     Mental Status: She is alert.     Comments: Mental Status:  Alert, oriented, thought content appropriate. Speech fluent without evidence of aphasia. Able to follow 2 step commands without difficulty.  Cranial Nerves:  II:  Peripheral visual fields grossly normal, pupils equal, round, reactive to light III,IV, VI: ptosis not present, extra-ocular motions intact bilaterally  V,VII: smile symmetric, facial light touch sensation equal VIII: hearing grossly normal bilaterally  IX,X: midline uvula rise  XI: bilateral shoulder shrug equal and strong XII: midline tongue extension  Motor:  5/5 in upper and lower extremities bilaterally including strong and equal grip strength and dorsiflexion/plantar flexion Sensory: Pinprick and light touch normal in all extremities.  Deep Tendon Reflexes: 2+ and symmetric  Cerebellar: normal finger-to-nose with bilateral upper extremities Gait: normal gait and balance CV: distal pulses palpable throughout       ED Treatments / Results  Labs (all labs ordered are listed, but only abnormal results are displayed) Labs Reviewed  CBC WITH DIFFERENTIAL/PLATELET - Abnormal; Notable for the following components:      Result Value   Hemoglobin 11.7 (*)    Platelets 131 (*)    All other components within normal limits  COMPREHENSIVE METABOLIC PANEL - Abnormal; Notable for the following components:   Potassium 3.0 (*)    Glucose, Bld 141 (*)    Calcium 8.4 (*)    ALT 50 (*)    All other components within normal limits  LIPASE, BLOOD  URINALYSIS, ROUTINE W REFLEX MICROSCOPIC    EKG EKG Interpretation  Date/Time:  Thursday August 08 2019 20:18:56 EDT Ventricular Rate:  72 PR Interval:    QRS Duration: 101 QT Interval:  470 QTC Calculation: 515 R Axis:   -43 Text Interpretation:  Sinus rhythm Left atrial  enlargement Abnormal R-wave progression, late transition Left ventricular hypertrophy Borderline T abnormalities, lateral leads Prolonged QT interval Confirmed by Milton Ferguson [phone]) on 08/08/2019 11:15:27 PM   Radiology Dg Abd Portable 1 View  Result Date: 08/08/2019 CLINICAL DATA:  53 year old female with nausea and vomiting. Positive for COVID-19. EXAM: PORTABLE ABDOMEN - 1 VIEW COMPARISON:  CT Abdomen and Pelvis 06/14/2017. FINDINGS: Portable  AP supine view at 2212 hours. Stable cholecystectomy clips. Non obstructed bowel gas pattern. Chronic small surgical clip in the left abdomen is stable. Chronic left hemipelvis phlebolith. Otherwise negative abdominal and pelvic visceral contours. No pneumoperitoneum is evident on this supine view. Chronic levoconvex scoliosis. No acute osseous abnormality identified. IMPRESSION: Negative, normal bowel gas pattern. Electronically Signed   By: Genevie Ann M.D.   On: 08/08/2019 22:53   Procedures Procedures (including critical care time)  Medications Ordered in ED Medications  sodium chloride 0.9 % bolus 1,000 mL (0 mLs Intravenous Stopped 08/08/19 2317)  ondansetron (ZOFRAN) injection 4 mg (4 mg Intravenous Given 08/08/19 2136)  potassium chloride 10 mEq in 100 mL IVPB (0 mEq Intravenous Stopped 08/08/19 2238)  potassium chloride SA (KLOR-CON) CR tablet 40 mEq (40 mEq Oral Given 08/08/19 2135)   Initial Impression / Assessment and Plan / ED Course  I have reviewed the triage vital signs and the nursing notes.  Pertinent labs & imaging results that were available during my care of the patient were reviewed by me and considered in my medical decision making (see chart for details).  53 year old female appears otherwise well presents for evaluation of emesis and diarrhea in setting of known Covid.  She was seen at [facility] 10 hrs. PTA.  She is afebrile, nonseptic, nonill appearing.  Abdomen soft, nontender without rebound or guarding.  Labs and  imaging reviewed from [facility], chest x-ray without cardiomegaly, infiltrates, pulmonary edema.  She had delta negative troponins.  EKG without ST/T changes.  Had improvement in symptoms however went home and had 2 episodes of nonbloody, nonbilious emesis.  Not take anything for emesis at home.  Heart and lungs clear.  She has no tachycardia, tachypnea or hypoxia.  Benign abdominal exam.  Labs and imaging personally reviewed CBC without leukocytosis, hemoglobin 11.7 at baseline Metabolic panel with hypokalemia at 3.0, given p.o. and IV replacement, the elevated glucose at 141.  Patient reevaluated.  Symptoms improved with IV fluids.  No chronic nausea.  She denied any episodes of emesis in ED.  She is tolerating p.o. intake without difficulty.  No hematemesis, nausea, hematochezia.  Patient is nontoxic, nonseptic appearing, in no apparent distress.  Patient's pain and other symptoms adequately managed in emergency department.  Fluid bolus given.  Labs, imaging and vitals reviewed.  Patient does not meet the SIRS or Sepsis criteria.  On repeat exam patient does not have a surgical abdomin and there are no peritoneal signs.  No indication of appendicitis, bowel obstruction, bowel perforation, cholecystitis, diverticulitis, PID or ectopic pregnancy.  Patient discharged home with symptomatic treatment and given strict instructions for follow-up with their primary care physician.  I have also discussed reasons to return immediately to the ER.  Patient expresses understanding and agrees with plan.  Symptoms likely related to her Covid diagnosis.  I have low suspicion for acute surgical abdominal emergency.  She denies chest pain, shortness of breath or hypoxia.  She is ambulatory in room with oxygen saturations greater than 95% on room air.  Her urinalysis obtained from [facility] do not show any evidence of UTI.  Do not think we need repeat urinalysis at this time.  LAELANI DAUGHTRIDGE was evaluated in Emergency  Department on 08/08/2019 for the symptoms described in the history of present illness. She was evaluated in the context of the global COVID-19 pandemic, which necessitated consideration that the patient might be at risk for infection with the SARS-CoV-2 virus that causes COVID-19.  Institutional protocols and algorithms that pertain to the evaluation of patients at risk for COVID-19 are in a state of rapid change based on information released by regulatory bodies including the CDC and federal and state organizations. These policies and algorithms were followed during the patient's care in the ED.      Final Clinical Impressions(s) / ED Diagnoses   Final diagnoses:  COVID-19  Nausea vomiting and diarrhea    ED Discharge Orders         Ordered    ondansetron (ZOFRAN ODT) 4 MG disintegrating tablet  Every 8 hours PRN     08/08/19 2316           Alveria Mcglaughlin A, PA-C 08/08/19 2323    Milton Ferguson, MD 08/10/19 1042

## 2019-08-08 NOTE — ED Triage Notes (Signed)
Patient was reportedly tested for COVID last week and was positive. Patient reportedly was at [facility] this morning and was given fluids for dehydration and weakness. Patient was d/c home and has multiple episodes of emesis since.
